# Patient Record
Sex: Female | Born: 1990 | Race: Black or African American | Hispanic: No | Marital: Single | Smoking: Former smoker
Health system: Southern US, Community
[De-identification: ages and names within clinical notes are randomized; demographics above are authoritative.]

## PROBLEM LIST (undated history)

## (undated) ENCOUNTER — Inpatient Hospital Stay (HOSPITAL_COMMUNITY): Payer: Self-pay

## (undated) DIAGNOSIS — A749 Chlamydial infection, unspecified: Secondary | ICD-10-CM

## (undated) HISTORY — PX: NO PAST SURGERIES: SHX2092

---

## 2012-05-03 ENCOUNTER — Emergency Department (INDEPENDENT_AMBULATORY_CARE_PROVIDER_SITE_OTHER)
Admission: EM | Admit: 2012-05-03 | Discharge: 2012-05-03 | Disposition: A | Discharge status: Home / Self Care | Payer: Medicaid Other | Source: Home / Self Care | Attending: Family Medicine | Admitting: Family Medicine

## 2012-05-03 ENCOUNTER — Emergency Department (INDEPENDENT_AMBULATORY_CARE_PROVIDER_SITE_OTHER): Payer: Medicaid Other

## 2012-05-03 ENCOUNTER — Encounter (HOSPITAL_COMMUNITY): Payer: Self-pay

## 2012-05-03 DIAGNOSIS — S93409A Sprain of unspecified ligament of unspecified ankle, initial encounter: Secondary | ICD-10-CM

## 2012-05-03 DIAGNOSIS — S93401A Sprain of unspecified ligament of right ankle, initial encounter: Secondary | ICD-10-CM

## 2012-05-03 MED ORDER — TRAMADOL HCL 50 MG PO TABS: 50.0000 mg | ORAL_TABLET | Freq: Four times a day (QID) | ORAL | Status: DC | PRN

## 2012-05-03 MED ORDER — IBUPROFEN 600 MG PO TABS: 600.0000 mg | ORAL_TABLET | Freq: Three times a day (TID) | ORAL | Status: DC | PRN

## 2012-05-03 NOTE — ED Notes (Signed)
C/o right ankle pain, injured last night going down stairs

## 2012-05-03 NOTE — ED Provider Notes (Signed)
History     CSN: [id number]  Arrival date & time 05/03/12  4098   First MD Initiated Contact with Patient 05/03/12 [phone]      Chief Complaint  Patient presents with  . Ankle Pain    (Consider location/radiation/quality/duration/timing/severity/associated sxs/prior treatment) HPI Comments: 21 year old female otherwise healthy. Here complaining of right ankle pain and swelling after she injured while going down stairs and tripping bending her right food in inverted position. Has been able to put weight on it but is tender when walking also increased pain with foot flexion and extension. Has not taken any pain medications and has not used ice or any other triple therapies. Denies dizziness, palpitations or syncope associated with her fall. Denies injury to any other body areas.    History reviewed. No pertinent past medical history.  History reviewed. No pertinent past surgical history.  No family history on file.  History  Substance Use Topics  . Smoking status: Not on file  . Smokeless tobacco: Not on file  . Alcohol Use: Not on file    OB History    Grav Para Term Preterm Abortions TAB SAB Ect Mult Living                  Review of Systems  Constitutional:       10 systems reviewed and  pertinent negative and positive symptoms are as per HPI.     HENT: Negative for neck pain.   Musculoskeletal:       Right ankle pain as per history of present illness  Neurological: Negative for dizziness, syncope and weakness.    Allergies  Review of patient's allergies indicates no known allergies.  Home Medications   Current Outpatient Rx  Name Route Sig Dispense Refill  . IBUPROFEN 600 MG PO TABS Oral Take 1 tablet (600 mg total) by mouth every 8 (eight) hours as needed for pain. 20 tablet 0  . TRAMADOL HCL 50 MG PO TABS Oral Take 1 tablet (50 mg total) by mouth every 6 (six) hours as needed for pain. 15 tablet 0    BP 122/73  Pulse 80  Temp 98.4 F (36.9 C) (Oral)   Resp 16  SpO2 99%  LMP 04/13/2012  Physical Exam  Nursing note and vitals reviewed. Constitutional: She is oriented to person, place, and time. She appears well-developed and well-nourished.  HENT:  Head: Normocephalic and atraumatic.  Eyes: Conjunctivae normal are normal.  Neck: Normal range of motion. Neck supple.  Cardiovascular: Normal heart sounds.   Pulmonary/Chest: Breath sounds normal.  Musculoskeletal:       Right foot: No obvious deformity. Weight bearing with reported discomfort with walking. tenderness to palpation and mild ankle swelling around lateral malleoli. Fair range of motion but with reported pain with flexion and extension. Impress no laxity on tilt test. No bruising, echymosis or hematomas. No tenderness or bruising over tibial bone.  Right foot appears neurovascularly intact.   Neurological: She is alert and oriented to person, place, and time.  Skin: No rash noted.    ED Course  Procedures (including critical care time)  Labs Reviewed - No data to display    1. Right ankle sprain       MDM  No fractures or acute findings on x-rays. Treated with ASO brace. Ibuprofen, tramadol and rehabilitation exercises. Asked to return if persistent symptoms after 1 or 2 weeks.        Sharin Grave, MD 05/05/12 [phone]

## 2012-05-03 NOTE — ED Notes (Signed)
Patient states she did not get written rx on d/c ; replacement rx called in to [facility] of [address] [place]  For same

## 2012-06-06 ENCOUNTER — Encounter (HOSPITAL_COMMUNITY): Payer: Self-pay

## 2012-06-06 ENCOUNTER — Emergency Department (HOSPITAL_COMMUNITY)
Admission: EM | Admit: 2012-06-06 | Discharge: 2012-06-06 | Disposition: A | Discharge status: Home / Self Care | Payer: PRIVATE HEALTH INSURANCE | Attending: Emergency Medicine | Admitting: Emergency Medicine

## 2012-06-06 DIAGNOSIS — N751 Abscess of Bartholin's gland: Secondary | ICD-10-CM | POA: Insufficient documentation

## 2012-06-06 DIAGNOSIS — Z79899 Other long term (current) drug therapy: Secondary | ICD-10-CM | POA: Insufficient documentation

## 2012-06-06 MED ORDER — LIDOCAINE HCL 1 % IJ SOLN
INTRAMUSCULAR | Status: AC
  Filled 2012-06-06: qty 20

## 2012-06-06 MED ORDER — ONDANSETRON 4 MG PO TBDP
4.0000 mg | ORAL_TABLET | Freq: Once | ORAL | Status: AC
  Administered 2012-06-06: 4 mg via ORAL
  Filled 2012-06-06: qty 1

## 2012-06-06 MED ORDER — SULFAMETHOXAZOLE-TRIMETHOPRIM 800-160 MG PO TABS: 1.0000 | ORAL_TABLET | Freq: Two times a day (BID) | ORAL | Status: DC

## 2012-06-06 MED ORDER — FLUCONAZOLE 200 MG PO TABS: 200.0000 mg | ORAL_TABLET | Freq: Every day | ORAL | Status: DC

## 2012-06-06 MED ORDER — OXYCODONE-ACETAMINOPHEN 5-325 MG PO TABS: 2.0000 | ORAL_TABLET | ORAL | Status: DC | PRN

## 2012-06-06 MED ORDER — LIDOCAINE HCL (PF) 1 % IJ SOLN
5.0000 mL | Freq: Once | INTRAMUSCULAR | Status: DC
  Filled 2012-06-06: qty 5

## 2012-06-06 MED ORDER — HYDROMORPHONE HCL PF 1 MG/ML IJ SOLN
1.0000 mg | Freq: Once | INTRAMUSCULAR | Status: AC
  Administered 2012-06-06: 1 mg via INTRAMUSCULAR
  Filled 2012-06-06: qty 1

## 2012-06-06 NOTE — ED Notes (Signed)
Patient states she has an abscess on her right labia that has been getting worse x 1 week. Patient denies any drainage at this time.

## 2012-06-06 NOTE — ED Provider Notes (Signed)
History     CSN: [id number]  Arrival date & time 06/06/12  0930   First MD Initiated Contact with Patient 06/06/12 1012      Chief Complaint  Patient presents with  . Abscess    (Consider location/radiation/quality/duration/timing/severity/associated sxs/prior treatment) HPI Comments: PT presents with one week hx of abscess to vagina.  It has been increasingly painful.  No drainage.  Taking OTC meds without relief.  No fevers.  No vomiting.  No hx of similar symptoms.  Has had constant throbbing pain to vagina.   History reviewed. No pertinent past medical history.  History reviewed. No pertinent past surgical history.  History reviewed. No pertinent family history.  History  Substance Use Topics  . Smoking status: Never Smoker   . Smokeless tobacco: Never Used  . Alcohol Use: Yes     weekend only    OB History    Grav Para Term Preterm Abortions TAB SAB Ect Mult Living                  Review of Systems  Constitutional: Negative for fever, chills, diaphoresis and fatigue.  HENT: Negative for congestion, rhinorrhea and sneezing.   Eyes: Negative.   Respiratory: Negative for cough, chest tightness and shortness of breath.   Cardiovascular: Negative for chest pain and leg swelling.  Gastrointestinal: Negative for nausea, vomiting, abdominal pain, diarrhea and blood in stool.  Genitourinary: Positive for genital sores and vaginal pain. Negative for frequency, hematuria, flank pain and difficulty urinating.  Musculoskeletal: Negative for back pain and arthralgias.  Skin: Negative for rash.  Neurological: Negative for dizziness, speech difficulty, weakness, numbness and headaches.    Allergies  Review of patient's allergies indicates no known allergies.  Home Medications   Current Outpatient Rx  Name Route Sig Dispense Refill  . IBUPROFEN 600 MG PO TABS Oral Take 1 tablet (600 mg total) by mouth every 8 (eight) hours as needed for pain. 20 tablet 0  . TRAMADOL  HCL 50 MG PO TABS Oral Take 1 tablet (50 mg total) by mouth every 6 (six) hours as needed for pain. 15 tablet 0  . FLUCONAZOLE 200 MG PO TABS Oral Take 1 tablet (200 mg total) by mouth daily. 1 tablet 0  . OXYCODONE-ACETAMINOPHEN 5-325 MG PO TABS Oral Take 2 tablets by mouth every 4 (four) hours as needed for pain. 15 tablet 0  . SULFAMETHOXAZOLE-TRIMETHOPRIM 800-160 MG PO TABS Oral Take 1 tablet by mouth 2 (two) times daily. 20 tablet 0    BP 132/77  Pulse 101  Temp 98.3 F (36.8 C) (Oral)  Resp 24  Ht 5\' 3"  (1.6 m)  Wt 170 lb (77.111 kg)  BMI 30.11 kg/m2  SpO2 100%  LMP 05/10/2012  Physical Exam  Constitutional: She is oriented to person, place, and time. She appears well-developed and well-nourished.  HENT:  Head: Normocephalic and atraumatic.  Eyes: Pupils are equal, round, and reactive to light.  Neck: Normal range of motion. Neck supple.  Cardiovascular: Normal rate, regular rhythm and normal heart sounds.   Pulmonary/Chest: Effort normal and breath sounds normal. No respiratory distress. She has no wheezes. She has no rales. She exhibits no tenderness.  Abdominal: Soft. Bowel sounds are normal. There is no tenderness. There is no rebound and no guarding.  Genitourinary:       2cm fluctuant abscess to medial vulva on right side.  Mild surrounding erythema  Musculoskeletal: Normal range of motion. She exhibits no edema.  Lymphadenopathy:  She has no cervical adenopathy.  Neurological: She is alert and oriented to person, place, and time.  Skin: Skin is warm and dry. No rash noted.  Psychiatric: She has a normal mood and affect.    ED Course  INCISION AND DRAINAGE Date/Time: 06/06/2012 11:54 AM Performed by: Mirriam Vadala Authorized by: Rolan Bucco Consent: Verbal consent obtained. Risks and benefits: risks, benefits and alternatives were discussed Consent given by: patient Patient identity confirmed: verbally with patient Type: abscess Body area:  anogenital Location details: Bartholin's gland Anesthesia: local infiltration Local anesthetic: lidocaine 1% without epinephrine Anesthetic total: 2 ml Patient sedated: no Scalpel size: 11 Incision type: single straight Complexity: simple Drainage: purulent Drainage amount: moderate Wound treatment: drain placed Patient tolerance: Patient tolerated the procedure well with no immediate complications. Comments: Word catheter placed   (including critical care time)  Labs Reviewed - No data to display No results found.   1. Bartholin's gland abscess       MDM  Pt started on bactrim, percocet.  Advised pt that she needs to f/u with ob/gyn for further management.  Advised if she cannot get in to see gyn, or if she has worsening symptoms, will need to return to ED        Rolan Bucco, MD 06/06/12 1159

## 2012-08-24 ENCOUNTER — Emergency Department (HOSPITAL_COMMUNITY)
Admission: EM | Admit: 2012-08-24 | Discharge: 2012-08-24 | Disposition: A | Discharge status: Home / Self Care | Payer: Self-pay

## 2012-08-24 NOTE — ED Notes (Signed)
Pt did not answer when called for registration or when called to be triage.

## 2013-01-02 ENCOUNTER — Inpatient Hospital Stay (HOSPITAL_COMMUNITY): Payer: PRIVATE HEALTH INSURANCE

## 2013-01-02 ENCOUNTER — Inpatient Hospital Stay (HOSPITAL_COMMUNITY)
Admission: AD | Admit: 2013-01-02 | Discharge: 2013-01-02 | Disposition: A | Discharge status: Home / Self Care | Payer: PRIVATE HEALTH INSURANCE | Source: Ambulatory Visit | Attending: Obstetrics & Gynecology | Admitting: Obstetrics & Gynecology

## 2013-01-02 ENCOUNTER — Emergency Department (HOSPITAL_COMMUNITY)
Admission: EM | Admit: 2013-01-02 | Discharge: 2013-01-02 | Disposition: A | Discharge status: Other Acute Inpatient Hospital | Payer: PRIVATE HEALTH INSURANCE | Attending: Emergency Medicine | Admitting: Emergency Medicine

## 2013-01-02 ENCOUNTER — Encounter (HOSPITAL_COMMUNITY): Payer: Self-pay | Admitting: *Deleted

## 2013-01-02 ENCOUNTER — Encounter (HOSPITAL_COMMUNITY): Payer: Self-pay

## 2013-01-02 DIAGNOSIS — O200 Threatened abortion: Secondary | ICD-10-CM

## 2013-01-02 DIAGNOSIS — O209 Hemorrhage in early pregnancy, unspecified: Secondary | ICD-10-CM | POA: Insufficient documentation

## 2013-01-02 DIAGNOSIS — B9689 Other specified bacterial agents as the cause of diseases classified elsewhere: Secondary | ICD-10-CM | POA: Insufficient documentation

## 2013-01-02 DIAGNOSIS — N949 Unspecified condition associated with female genital organs and menstrual cycle: Secondary | ICD-10-CM | POA: Insufficient documentation

## 2013-01-02 DIAGNOSIS — A499 Bacterial infection, unspecified: Secondary | ICD-10-CM | POA: Insufficient documentation

## 2013-01-02 DIAGNOSIS — N760 Acute vaginitis: Secondary | ICD-10-CM | POA: Insufficient documentation

## 2013-01-02 DIAGNOSIS — N939 Abnormal uterine and vaginal bleeding, unspecified: Secondary | ICD-10-CM

## 2013-01-02 DIAGNOSIS — O2390 Unspecified genitourinary tract infection in pregnancy, unspecified trimester: Secondary | ICD-10-CM | POA: Insufficient documentation

## 2013-01-02 DIAGNOSIS — N938 Other specified abnormal uterine and vaginal bleeding: Secondary | ICD-10-CM | POA: Insufficient documentation

## 2013-01-02 LAB — CBC
Hemoglobin: 12.1 g/dL (ref 12.0–15.0)
RBC: 3.85 MIL/uL — ABNORMAL LOW (ref 3.87–5.11)
WBC: 10.9 10*3/uL — ABNORMAL HIGH (ref 4.0–10.5)

## 2013-01-02 LAB — URINALYSIS, ROUTINE W REFLEX MICROSCOPIC
Bilirubin Urine: NEGATIVE
Glucose, UA: NEGATIVE mg/dL
Specific Gravity, Urine: <1.005 — ABNORMAL LOW (ref 1.005–1.030)
Urobilinogen, UA: 0.2 mg/dL (ref 0.0–1.0)
pH: 6 (ref 5.0–8.0)

## 2013-01-02 LAB — WET PREP, GENITAL
Trich, Wet Prep: NONE SEEN
Yeast Wet Prep HPF POC: NONE SEEN

## 2013-01-02 LAB — HCG, QUANTITATIVE, PREGNANCY: hCG, Beta Chain, Quant, S: 32968 m[IU]/mL — ABNORMAL HIGH (ref <5)

## 2013-01-02 MED ORDER — METRONIDAZOLE 500 MG PO TABS: 500.0000 mg | ORAL_TABLET | Freq: Two times a day (BID) | ORAL | Status: DC

## 2013-01-02 NOTE — MAU Note (Signed)
Vaginal bleeding since Friday am. Started out as spotting but alittle more now. No pain. I see Dr Jerry Caras.

## 2013-01-02 NOTE — ED Notes (Signed)
Pt presents w/ c/o vaginal bleeding x 3 days, light spotting. Pt denies n/v/d. Pt G2P0.

## 2013-01-02 NOTE — ED Notes (Signed)
Pt screened by Ebbie Ridge, PA-C and sent to [facility] for evaluation. Discharged, in no acute distress, ambulatory, stable.

## 2013-01-02 NOTE — MAU Provider Note (Signed)
History     CSN: [id number]  Arrival date and time: 01/02/13 0050   First Provider Initiated Contact with Patient 01/02/13 0122      Chief Complaint  Patient presents with  . Vaginal Bleeding   HPI  Pt is a G2P0010 at 6.5 wks IUP here with report of vaginal bleeding since Friday am. Bleeding started out as spotting but is increasing in amount.  Denies pelvic pain.  +appt with  Dr Claiborne Billings June 2.     History reviewed. No pertinent past medical history.  History reviewed. No pertinent past surgical history.  No family history on file.  History  Substance Use Topics  . Smoking status: Current Every Day Smoker  . Smokeless tobacco: Never Used  . Alcohol Use: Yes     Comment: quit weeks ago    Allergies: No Known Allergies  Prescriptions prior to admission  Medication Sig Dispense Refill  . fluconazole (DIFLUCAN) 200 MG tablet Take 1 tablet (200 mg total) by mouth daily.  1 tablet  0  . ibuprofen (ADVIL,MOTRIN) 600 MG tablet Take 1 tablet (600 mg total) by mouth every 8 (eight) hours as needed for pain.  20 tablet  0  . oxyCODONE-acetaminophen (PERCOCET/ROXICET) 5-325 MG per tablet Take 2 tablets by mouth every 4 (four) hours as needed for pain.  15 tablet  0  . sulfamethoxazole-trimethoprim (SEPTRA DS) 800-160 MG per tablet Take 1 tablet by mouth 2 (two) times daily.  20 tablet  0  . traMADol (ULTRAM) 50 MG tablet Take 1 tablet (50 mg total) by mouth every 6 (six) hours as needed for pain.  15 tablet  0    Review of Systems  Genitourinary:       Vaginal bleeding  All other systems reviewed and are negative.   Physical Exam   Blood pressure 122/60, pulse 89, temperature 98.7 F (37.1 C), temperature source Oral, resp. rate 18, height 5' 3.5" (1.613 m), weight 70.217 kg (154 lb 12.8 oz), last menstrual period 11/16/2012, SpO2 100.00%.  Physical Exam  Constitutional: She is oriented to person, place, and time. She appears well-developed and well-nourished. No  distress.  HENT:  Head: Normocephalic.  Neck: Normal range of motion. Neck supple.  Cardiovascular: Normal rate, regular rhythm and normal heart sounds.   Respiratory: Effort normal and breath sounds normal. No respiratory distress.  GI: Soft. There is no tenderness.  Genitourinary: There is bleeding (scant) around the vagina.  Musculoskeletal: Normal range of motion.  Neurological: She is alert and oriented to person, place, and time.  Skin: Skin is warm and dry.    MAU Course  Procedures  Results for orders placed during the hospital encounter of 01/02/13 (from the past 24 hour(s))  URINALYSIS, ROUTINE W REFLEX MICROSCOPIC     Status: Abnormal   Collection Time    01/02/13  1:10 AM      Result Value Range   Color, Urine YELLOW  YELLOW   APPearance CLEAR  CLEAR   Specific Gravity, Urine <1.005 (*) 1.005 - 1.030   pH 6.0  5.0 - 8.0   Glucose, UA NEGATIVE  NEGATIVE mg/dL   Hgb urine dipstick MODERATE (*) NEGATIVE   Bilirubin Urine NEGATIVE  NEGATIVE   Ketones, ur NEGATIVE  NEGATIVE mg/dL   Protein, ur NEGATIVE  NEGATIVE mg/dL   Urobilinogen, UA 0.2  0.0 - 1.0 mg/dL   Nitrite NEGATIVE  NEGATIVE   Leukocytes, UA NEGATIVE  NEGATIVE  URINE MICROSCOPIC-ADD ON     Status:  None   Collection Time    01/02/13  1:10 AM      Result Value Range   Squamous Epithelial / LPF RARE  RARE   WBC, UA 0-2  <3 WBC/hpf   RBC / HPF 0-2  <3 RBC/hpf   Bacteria, UA RARE  RARE  POCT PREGNANCY, URINE     Status: Abnormal   Collection Time    01/02/13  1:12 AM      Result Value Range   Preg Test, Ur POSITIVE (*) NEGATIVE  CBC     Status: Abnormal   Collection Time    01/02/13  1:21 AM      Result Value Range   WBC 10.9 (*) 4.0 - 10.5 K/uL   RBC 3.85 (*) 3.87 - 5.11 MIL/uL   Hemoglobin 12.1  12.0 - 15.0 g/dL   HCT 53.6 (*) 64.4 - 03.4 %   MCV 88.8  78.0 - 100.0 fL   MCH 31.4  26.0 - 34.0 pg   MCHC 35.4  30.0 - 36.0 g/dL   RDW 74.2  59.5 - 63.8 %   Platelets 240  150 - 400 K/uL  HCG,  QUANTITATIVE, PREGNANCY     Status: Abnormal   Collection Time    01/02/13  1:21 AM      Result Value Range   hCG, Beta Chain, Quant, S 75643 (*) <5 mIU/mL  WET PREP, GENITAL     Status: Abnormal   Collection Time    01/02/13  1:21 AM      Result Value Range   Yeast Wet Prep HPF POC NONE SEEN  NONE SEEN   Trich, Wet Prep NONE SEEN  NONE SEEN   Clue Cells Wet Prep HPF POC FEW (*) NONE SEEN   WBC, Wet Prep HPF POC FEW (*) NONE SEEN  ABO/RH     Status: None   Collection Time    01/02/13  1:21 AM      Result Value Range   ABO/RH(D) A POS     Assessment and Plan  Bacterial Vaginosis Anembryonic Pregnancy  Plan: Pt desires to wait instead of doing cytotec; will keep appt scheduled for June 2nd. Due to cystic lesion in right adnexa pt will return in 48 hrs for BHCG. Bleeding and ectopic precautions given. Consulted with Dr. Debroah Loop who agrees with plan of care.  RX Flagyl   [facility] 01/02/2013, 1:27 AM

## 2013-01-04 LAB — GC/CHLAMYDIA PROBE AMP
CT Probe RNA: NEGATIVE
GC Probe RNA: NEGATIVE

## 2013-01-10 NOTE — ED Provider Notes (Signed)
Medical screening examination/treatment/procedure(s) were performed by non-physician practitioner and as supervising physician I was immediately available for consultation/collaboration.MSE performed by [facility]  Sunnie Nielsen, MD 01/10/13 2329

## 2013-01-10 NOTE — ED Provider Notes (Signed)
Patient presented to the ER with vaginal bleeding, and was currently about 8 weeks.  Patient was stable with her vital signs the patient was explained that we do not have capabilities of running.  Lab tests, x-rays and a urine due to the fact that her systems were down.  I spoke with [facility] nurse practitioner who agreed to accept the patient in transfer.  Patient, again was reassessed and her vitals were stable and she agreed to this plan for transfer to a facility that can handle her needs.  Patient's heart and lungs were normal and she had no abdominal pain on exam  Carlyle Dolly, PA-C 01/10/13 1604

## 2013-09-20 LAB — OB RESULTS CONSOLE GC/CHLAMYDIA
CHLAMYDIA, DNA PROBE: NEGATIVE
GC PROBE AMP, GENITAL: NEGATIVE

## 2013-09-20 LAB — OB RESULTS CONSOLE ABO/RH: RH TYPE: POSITIVE

## 2013-09-20 LAB — OB RESULTS CONSOLE ANTIBODY SCREEN: Antibody Screen: NEGATIVE

## 2013-09-20 LAB — OB RESULTS CONSOLE RUBELLA ANTIBODY, IGM: Rubella: UNDETERMINED

## 2013-09-20 LAB — OB RESULTS CONSOLE RPR: RPR: NONREACTIVE

## 2013-09-20 LAB — OB RESULTS CONSOLE HIV ANTIBODY (ROUTINE TESTING): HIV: NONREACTIVE

## 2013-09-20 LAB — OB RESULTS CONSOLE HEPATITIS B SURFACE ANTIGEN: Hepatitis B Surface Ag: NEGATIVE

## 2013-11-07 ENCOUNTER — Encounter (HOSPITAL_COMMUNITY): Payer: Self-pay | Admitting: *Deleted

## 2014-03-23 LAB — OB RESULTS CONSOLE GBS: STREP GROUP B AG: NEGATIVE

## 2014-03-28 ENCOUNTER — Encounter (HOSPITAL_COMMUNITY): Payer: Self-pay | Admitting: *Deleted

## 2014-03-28 ENCOUNTER — Inpatient Hospital Stay (HOSPITAL_COMMUNITY)
Admission: AD | Admit: 2014-03-28 | Discharge: 2014-03-28 | Disposition: A | Discharge status: Home / Self Care | Payer: 59 | Source: Ambulatory Visit | Attending: Obstetrics and Gynecology | Admitting: Obstetrics and Gynecology

## 2014-03-28 DIAGNOSIS — O4700 False labor before 37 completed weeks of gestation, unspecified trimester: Secondary | ICD-10-CM | POA: Diagnosis not present

## 2014-03-28 DIAGNOSIS — O99891 Other specified diseases and conditions complicating pregnancy: Secondary | ICD-10-CM | POA: Insufficient documentation

## 2014-03-28 DIAGNOSIS — O9989 Other specified diseases and conditions complicating pregnancy, childbirth and the puerperium: Secondary | ICD-10-CM

## 2014-03-28 HISTORY — DX: Chlamydial infection, unspecified: A74.9

## 2014-03-28 LAB — WET PREP, GENITAL
Clue Cells Wet Prep HPF POC: NONE SEEN
Trich, Wet Prep: NONE SEEN

## 2014-03-28 NOTE — MAU Provider Note (Signed)
Linda Koch is a 23 y.o. G4P0020 at 5253w1d here reporting leaking fluid. RN requests speculum exam for rule out SROM only.   O: Spec: negative for pool, although copius, thin sl malodorous white discharge Fern: negative, TNTC WBC, ? Trich seen  FHR: 140 bpm, variability: moderate,  accelerations:  Present,  decelerations:  Absent and  TOCO irreg/irritability  A/P: intact membranes Wet prep sent to lab RN to report to MD for plan of care

## 2014-03-28 NOTE — MAU Provider Note (Signed)
I was not informed about the question of trichomoniasis seen on wet prep. Pt is currently receiving Flagyl, on day 3 for treatment of BV.  I called and spoke with Georges MouseNatalie Frazier and she stated she did not see anything moving on the slide that was collected, but sent a wet prep to be sure and that came back as negative for trich.

## 2014-03-28 NOTE — Discharge Instructions (Signed)
Third Trimester of Pregnancy °The third trimester is from week 29 through week 42, months 7 through 9. This trimester is when your unborn baby (fetus) is growing very fast. At the end of the ninth month, the unborn baby is about 20 inches in length. It weighs about 6-10 pounds.  °HOME CARE  °· Avoid all smoking, herbs, and alcohol. Avoid drugs not approved by your doctor. °· Only take medicine as told by your doctor. Some medicines are safe and some are not during pregnancy. °· Exercise only as told by your doctor. Stop exercising if you start having cramps. °· Eat regular, healthy meals. °· Wear a good support bra if your breasts are tender. °· Do not use hot tubs, steam rooms, or saunas. °· Wear your seat belt when driving. °· Avoid raw meat, uncooked cheese, and liter boxes and soil used by cats. °· Take your prenatal vitamins. °· Try taking medicine that helps you poop (stool softener) as needed, and if your doctor approves. Eat more fiber by eating fresh fruit, vegetables, and whole grains. Drink enough fluids to keep your pee (urine) clear or pale yellow. °· Take warm water baths (sitz baths) to soothe pain or discomfort caused by hemorrhoids. Use hemorrhoid cream if your doctor approves. °· If you have puffy, bulging veins (varicose veins), wear support hose. Raise (elevate) your feet for 15 minutes, 3-4 times a day. Limit salt in your diet. °· Avoid heavy lifting, wear low heels, and sit up straight. °· Rest with your legs raised if you have leg cramps or low back pain. °· Visit your dentist if you have not gone during your pregnancy. Use a soft toothbrush to brush your teeth. Be gentle when you floss. °· You can have sex (intercourse) unless your doctor tells you not to. °· Do not travel far distances unless you must. Only do so with your doctor's approval. °· Take prenatal classes. °· Practice driving to the hospital. °· Pack your hospital bag. °· Prepare the baby's room. °· Go to your doctor visits. °GET  HELP IF: °· You are not sure if you are in labor or if your water has broken. °· You are dizzy. °· You have mild cramps or pressure in your lower belly (abdominal). °· You have a nagging pain in your belly area. °· You continue to feel sick to your stomach (nauseous), throw up (vomit), or have watery poop (diarrhea). °· You have bad smelling fluid coming from your vagina. °· You have pain with peeing (urination). °GET HELP RIGHT AWAY IF:  °· You have a fever. °· You are leaking fluid from your vagina. °· You are spotting or bleeding from your vagina. °· You have severe belly cramping or pain. °· You lose or gain weight rapidly. °· You have trouble catching your breath and have chest pain. °· You notice sudden or extreme puffiness (swelling) of your face, hands, ankles, feet, or legs. °· You have not felt the baby move in over an hour. °· You have severe headaches that do not go away with medicine. °· You have vision changes. °Document Released: 10/22/2009 Document Revised: 11/22/2012 Document Reviewed: 09/28/2012 °[employer]® Patient Information ©2015 [employer]. This information is not intended to replace advice given to you by your health care provider. Make sure you discuss any questions you have with your health care provider. °Fetal Movement Counts °Patient Name: __________________________________________________ Patient Due Date: ____________________ °Performing a fetal movement count is highly recommended in high-risk pregnancies, but it is good   for every pregnant woman to do. Your health care provider may ask you to start counting fetal movements at 28 weeks of the pregnancy. Fetal movements often increase: °· After eating a full meal. °· After physical activity. °· After eating or drinking something sweet or cold. °· At rest. °Pay attention to when you feel the baby is most active. This will help you notice a pattern of your baby's sleep and wake cycles and what factors contribute to an increase in fetal  movement. It is important to perform a fetal movement count at the same time each day when your baby is normally most active.  °HOW TO COUNT FETAL MOVEMENTS °1. Find a quiet and comfortable area to sit or lie down on your left side. Lying on your left side provides the best blood and oxygen circulation to your baby. °2. Write down the day and time on a sheet of paper or in a journal. °3. Start counting kicks, flutters, swishes, rolls, or jabs in a 2-hour period. You should feel at least 10 movements within 2 hours. °4. If you do not feel 10 movements in 2 hours, wait 2-3 hours and count again. Look for a change in the pattern or not enough counts in 2 hours. °SEEK MEDICAL CARE IF: °· You feel less than 10 counts in 2 hours, tried twice. °· There is no movement in over an hour. °· The pattern is changing or taking longer each day to reach 10 counts in 2 hours. °· You feel the baby is not moving as he or she usually does. °Date: ____________ Movements: ____________ Start time: ____________ Finish time: ____________  °Date: ____________ Movements: ____________ Start time: ____________ Finish time: ____________ °Date: ____________ Movements: ____________ Start time: ____________ Finish time: ____________ °Date: ____________ Movements: ____________ Start time: ____________ Finish time: ____________ °Date: ____________ Movements: ____________ Start time: ____________ Finish time: ____________ °Date: ____________ Movements: ____________ Start time: ____________ Finish time: ____________ °Date: ____________ Movements: ____________ Start time: ____________ Finish time: ____________ °Date: ____________ Movements: ____________ Start time: ____________ Finish time: ____________  °Date: ____________ Movements: ____________ Start time: ____________ Finish time: ____________ °Date: ____________ Movements: ____________ Start time: ____________ Finish time: ____________ °Date: ____________ Movements: ____________ Start time:  ____________ Finish time: ____________ °Date: ____________ Movements: ____________ Start time: ____________ Finish time: ____________ °Date: ____________ Movements: ____________ Start time: ____________ Finish time: ____________ °Date: ____________ Movements: ____________ Start time: ____________ Finish time: ____________ °Date: ____________ Movements: ____________ Start time: ____________ Finish time: ____________  °Date: ____________ Movements: ____________ Start time: ____________ Finish time: ____________ °Date: ____________ Movements: ____________ Start time: ____________ Finish time: ____________ °Date: ____________ Movements: ____________ Start time: ____________ Finish time: ____________ °Date: ____________ Movements: ____________ Start time: ____________ Finish time: ____________ °Date: ____________ Movements: ____________ Start time: ____________ Finish time: ____________ °Date: ____________ Movements: ____________ Start time: ____________ Finish time: ____________ °Date: ____________ Movements: ____________ Start time: ____________ Finish time: ____________  °Date: ____________ Movements: ____________ Start time: ____________ Finish time: ____________ °Date: ____________ Movements: ____________ Start time: ____________ Finish time: ____________ °Date: ____________ Movements: ____________ Start time: ____________ Finish time: ____________ °Date: ____________ Movements: ____________ Start time: ____________ Finish time: ____________ °Date: ____________ Movements: ____________ Start time: ____________ Finish time: ____________ °Date: ____________ Movements: ____________ Start time: ____________ Finish time: ____________ °Date: ____________ Movements: ____________ Start time: ____________ Finish time: ____________  °Date: ____________ Movements: ____________ Start time: ____________ Finish time: ____________ °Date: ____________ Movements: ____________ Start time: ____________ Finish time: ____________ °Date:  ____________ Movements: ____________ Start time: ____________ Finish time: ____________ °Date: ____________   Movements: ____________ Start time: ____________ Finish time: ____________ °Date: ____________ Movements: ____________ Start time: ____________ Finish time: ____________ °Date: ____________ Movements: ____________ Start time: ____________ Finish time: ____________ °Date: ____________ Movements: ____________ Start time: ____________ Finish time: ____________  °Date: ____________ Movements: ____________ Start time: ____________ Finish time: ____________ °Date: ____________ Movements: ____________ Start time: ____________ Finish time: ____________ °Date: ____________ Movements: ____________ Start time: ____________ Finish time: ____________ °Date: ____________ Movements: ____________ Start time: ____________ Finish time: ____________ °Date: ____________ Movements: ____________ Start time: ____________ Finish time: ____________ °Date: ____________ Movements: ____________ Start time: ____________ Finish time: ____________ °Date: ____________ Movements: ____________ Start time: ____________ Finish time: ____________  °Date: ____________ Movements: ____________ Start time: ____________ Finish time: ____________ °Date: ____________ Movements: ____________ Start time: ____________ Finish time: ____________ °Date: ____________ Movements: ____________ Start time: ____________ Finish time: ____________ °Date: ____________ Movements: ____________ Start time: ____________ Finish time: ____________ °Date: ____________ Movements: ____________ Start time: ____________ Finish time: ____________ °Date: ____________ Movements: ____________ Start time: ____________ Finish time: ____________ °Date: ____________ Movements: ____________ Start time: ____________ Finish time: ____________  °Date: ____________ Movements: ____________ Start time: ____________ Finish time: ____________ °Date: ____________ Movements: ____________ Start  time: ____________ Finish time: ____________ °Date: ____________ Movements: ____________ Start time: ____________ Finish time: ____________ °Date: ____________ Movements: ____________ Start time: ____________ Finish time: ____________ °Date: ____________ Movements: ____________ Start time: ____________ Finish time: ____________ °Date: ____________ Movements: ____________ Start time: ____________ Finish time: ____________ °Document Released: 08/27/2006 Document Revised: 12/12/2013 Document Reviewed: 05/24/2012 °[employer]® Patient Information ©2015 [employer]. This information is not intended to replace advice given to you by your health care provider. Make sure you discuss any questions you have with your health care provider. ° °

## 2014-03-28 NOTE — MAU Note (Signed)
Pt reports contractions and pressure. ? Leaking fluid since 1630

## 2014-03-29 ENCOUNTER — Inpatient Hospital Stay (HOSPITAL_COMMUNITY): Payer: 59 | Admitting: Anesthesiology

## 2014-03-29 ENCOUNTER — Inpatient Hospital Stay (HOSPITAL_COMMUNITY)
Admission: AD | Admit: 2014-03-29 | Discharge: 2014-04-01 | DRG: 765 | Disposition: A | Discharge status: Home / Self Care | Payer: 59 | Source: Ambulatory Visit | Attending: Obstetrics and Gynecology | Admitting: Obstetrics and Gynecology

## 2014-03-29 ENCOUNTER — Encounter (HOSPITAL_COMMUNITY)
Admission: AD | Disposition: A | Discharge status: Home / Self Care | Payer: Self-pay | Source: Ambulatory Visit | Attending: Obstetrics and Gynecology

## 2014-03-29 ENCOUNTER — Encounter (HOSPITAL_COMMUNITY): Payer: Self-pay

## 2014-03-29 ENCOUNTER — Encounter (HOSPITAL_COMMUNITY): Payer: 59 | Admitting: Anesthesiology

## 2014-03-29 DIAGNOSIS — D649 Anemia, unspecified: Secondary | ICD-10-CM | POA: Diagnosis present

## 2014-03-29 DIAGNOSIS — O4700 False labor before 37 completed weeks of gestation, unspecified trimester: Secondary | ICD-10-CM | POA: Diagnosis present

## 2014-03-29 DIAGNOSIS — O99344 Other mental disorders complicating childbirth: Secondary | ICD-10-CM | POA: Diagnosis present

## 2014-03-29 DIAGNOSIS — O99334 Smoking (tobacco) complicating childbirth: Secondary | ICD-10-CM | POA: Diagnosis present

## 2014-03-29 DIAGNOSIS — F1210 Cannabis abuse, uncomplicated: Secondary | ICD-10-CM | POA: Diagnosis present

## 2014-03-29 DIAGNOSIS — Z9889 Other specified postprocedural states: Secondary | ICD-10-CM

## 2014-03-29 DIAGNOSIS — O9902 Anemia complicating childbirth: Secondary | ICD-10-CM | POA: Diagnosis present

## 2014-03-29 LAB — CBC
HEMATOCRIT: 28.6 % — AB (ref 36.0–46.0)
HEMOGLOBIN: 9.8 g/dL — AB (ref 12.0–15.0)
MCH: 30.8 pg (ref 26.0–34.0)
MCHC: 34.3 g/dL (ref 30.0–36.0)
MCV: 89.9 fL (ref 78.0–100.0)
Platelets: 208 10*3/uL (ref 150–400)
RBC: 3.18 MIL/uL — AB (ref 3.87–5.11)
RDW: 12.9 % (ref 11.5–15.5)
WBC: 18.8 10*3/uL — AB (ref 4.0–10.5)

## 2014-03-29 LAB — RPR

## 2014-03-29 SURGERY — Surgical Case
Anesthesia: Epidural | Site: Abdomen

## 2014-03-29 MED ORDER — PENICILLIN G POTASSIUM 5000000 UNITS IJ SOLR
2.5000 10*6.[IU] | INTRAVENOUS | Status: DC
  Filled 2014-03-29 (×4): qty 2.5

## 2014-03-29 MED ORDER — OXYTOCIN 10 UNIT/ML IJ SOLN
INTRAMUSCULAR | Status: AC
  Filled 2014-03-29: qty 4

## 2014-03-29 MED ORDER — PROMETHAZINE HCL 25 MG/ML IJ SOLN: 6.2500 mg | INTRAMUSCULAR | Status: DC | PRN

## 2014-03-29 MED ORDER — KETOROLAC TROMETHAMINE 30 MG/ML IJ SOLN
INTRAMUSCULAR | Status: AC
  Filled 2014-03-29: qty 1

## 2014-03-29 MED ORDER — DIPHENHYDRAMINE HCL 50 MG/ML IJ SOLN: 12.5000 mg | INTRAMUSCULAR | Status: DC | PRN

## 2014-03-29 MED ORDER — FENTANYL 2.5 MCG/ML BUPIVACAINE 1/10 % EPIDURAL INFUSION (WH - ANES)
INTRAMUSCULAR | Status: DC | PRN
  Administered 2014-03-29: 14 mL/h via EPIDURAL

## 2014-03-29 MED ORDER — MORPHINE SULFATE (PF) 0.5 MG/ML IJ SOLN
INTRAMUSCULAR | Status: DC | PRN
  Administered 2014-03-29: 1 mg via INTRAVENOUS

## 2014-03-29 MED ORDER — MEPERIDINE HCL 25 MG/ML IJ SOLN
6.2500 mg | INTRAMUSCULAR | Status: AC | PRN
  Administered 2014-03-29 (×2): 6.25 mg via INTRAVENOUS

## 2014-03-29 MED ORDER — ONDANSETRON HCL 4 MG/2ML IJ SOLN
INTRAMUSCULAR | Status: DC | PRN
  Administered 2014-03-29: 4 mg via INTRAVENOUS

## 2014-03-29 MED ORDER — LIDOCAINE HCL (PF) 1 % IJ SOLN
INTRAMUSCULAR | Status: DC | PRN
  Administered 2014-03-29 (×2): 4 mL

## 2014-03-29 MED ORDER — SODIUM BICARBONATE 8.4 % IV SOLN
INTRAVENOUS | Status: DC | PRN
  Administered 2014-03-29 (×3): 5 mL via EPIDURAL

## 2014-03-29 MED ORDER — KETOROLAC TROMETHAMINE 30 MG/ML IJ SOLN: 30.0000 mg | Freq: Four times a day (QID) | INTRAMUSCULAR | Status: AC | PRN

## 2014-03-29 MED ORDER — OXYCODONE-ACETAMINOPHEN 5-325 MG PO TABS: 1.0000 | ORAL_TABLET | ORAL | Status: DC | PRN

## 2014-03-29 MED ORDER — ONDANSETRON HCL 4 MG/2ML IJ SOLN
4.0000 mg | Freq: Four times a day (QID) | INTRAMUSCULAR | Status: DC | PRN
  Administered 2014-03-29: 4 mg via INTRAVENOUS
  Filled 2014-03-29: qty 2

## 2014-03-29 MED ORDER — BUTORPHANOL TARTRATE 1 MG/ML IJ SOLN
1.0000 mg | INTRAMUSCULAR | Status: DC | PRN
  Administered 2014-03-29 (×2): 1 mg via INTRAVENOUS
  Filled 2014-03-29 (×2): qty 1

## 2014-03-29 MED ORDER — MEPERIDINE HCL 25 MG/ML IJ SOLN: 6.2500 mg | INTRAMUSCULAR | Status: DC | PRN

## 2014-03-29 MED ORDER — LACTATED RINGERS IV SOLN
INTRAVENOUS | Status: DC | PRN
  Administered 2014-03-29 (×2): via INTRAVENOUS

## 2014-03-29 MED ORDER — ONDANSETRON HCL 4 MG/2ML IJ SOLN
INTRAMUSCULAR | Status: AC
  Filled 2014-03-29: qty 2

## 2014-03-29 MED ORDER — PHENYLEPHRINE 40 MCG/ML (10ML) SYRINGE FOR IV PUSH (FOR BLOOD PRESSURE SUPPORT)
PREFILLED_SYRINGE | INTRAVENOUS | Status: DC
Start: 2014-03-29 — End: 2014-03-29
  Filled 2014-03-29: qty 10

## 2014-03-29 MED ORDER — LIDOCAINE HCL (PF) 1 % IJ SOLN
30.0000 mL | INTRAMUSCULAR | Status: DC | PRN
  Filled 2014-03-29: qty 30

## 2014-03-29 MED ORDER — CEFAZOLIN SODIUM-DEXTROSE 2-3 GM-% IV SOLR
INTRAVENOUS | Status: DC | PRN
  Administered 2014-03-29: 2 g via INTRAVENOUS

## 2014-03-29 MED ORDER — MEPERIDINE HCL 25 MG/ML IJ SOLN
INTRAMUSCULAR | Status: DC | PRN
  Administered 2014-03-29: 25 mg via INTRAVENOUS

## 2014-03-29 MED ORDER — LACTATED RINGERS IV SOLN
40.0000 [IU] | INTRAVENOUS | Status: DC | PRN
  Administered 2014-03-29: 40 [IU] via INTRAVENOUS

## 2014-03-29 MED ORDER — LACTATED RINGERS IV SOLN
500.0000 mL | INTRAVENOUS | Status: DC | PRN
  Administered 2014-03-29: 1000 mL via INTRAVENOUS

## 2014-03-29 MED ORDER — MEPERIDINE HCL 25 MG/ML IJ SOLN
INTRAMUSCULAR | Status: AC
  Filled 2014-03-29: qty 1

## 2014-03-29 MED ORDER — ACETAMINOPHEN 325 MG PO TABS: 650.0000 mg | ORAL_TABLET | ORAL | Status: DC | PRN

## 2014-03-29 MED ORDER — IBUPROFEN 600 MG PO TABS: 600.0000 mg | ORAL_TABLET | Freq: Four times a day (QID) | ORAL | Status: DC | PRN

## 2014-03-29 MED ORDER — OXYTOCIN 40 UNITS IN LACTATED RINGERS INFUSION - SIMPLE MED
62.5000 mL/h | INTRAVENOUS | Status: DC
  Filled 2014-03-29: qty 1000

## 2014-03-29 MED ORDER — 0.9 % SODIUM CHLORIDE (POUR BTL) OPTIME
TOPICAL | Status: DC | PRN
  Administered 2014-03-29: 1000 mL

## 2014-03-29 MED ORDER — PHENYLEPHRINE 40 MCG/ML (10ML) SYRINGE FOR IV PUSH (FOR BLOOD PRESSURE SUPPORT): 80.0000 ug | PREFILLED_SYRINGE | INTRAVENOUS | Status: DC | PRN

## 2014-03-29 MED ORDER — LACTATED RINGERS IV SOLN
INTRAVENOUS | Status: DC
  Administered 2014-03-29: 1000 mL via INTRAVENOUS
  Administered 2014-03-29: 11:00:00 via INTRAVENOUS

## 2014-03-29 MED ORDER — SCOPOLAMINE 1 MG/3DAYS TD PT72
1.0000 | MEDICATED_PATCH | Freq: Once | TRANSDERMAL | Status: AC
  Administered 2014-03-29: 1.5 mg via TRANSDERMAL

## 2014-03-29 MED ORDER — PENICILLIN G POTASSIUM 5000000 UNITS IJ SOLR
5.0000 10*6.[IU] | Freq: Once | INTRAMUSCULAR | Status: AC
  Administered 2014-03-29: 5 10*6.[IU] via INTRAVENOUS
  Filled 2014-03-29: qty 5

## 2014-03-29 MED ORDER — PHENYLEPHRINE HCL 10 MG/ML IJ SOLN
INTRAMUSCULAR | Status: DC | PRN
  Administered 2014-03-29 (×2): 80 ug via INTRAVENOUS
  Administered 2014-03-29 (×2): 40 ug via INTRAVENOUS
  Administered 2014-03-29 (×2): 80 ug via INTRAVENOUS

## 2014-03-29 MED ORDER — MORPHINE SULFATE (PF) 0.5 MG/ML IJ SOLN
INTRAMUSCULAR | Status: DC | PRN
  Administered 2014-03-29: 4 mg via EPIDURAL

## 2014-03-29 MED ORDER — CITRIC ACID-SODIUM CITRATE 334-500 MG/5ML PO SOLN
30.0000 mL | ORAL | Status: DC | PRN
  Administered 2014-03-29 (×2): 30 mL via ORAL
  Filled 2014-03-29 (×2): qty 15

## 2014-03-29 MED ORDER — MIDAZOLAM HCL 2 MG/2ML IJ SOLN: 0.5000 mg | Freq: Once | INTRAMUSCULAR | Status: AC | PRN

## 2014-03-29 MED ORDER — FLEET ENEMA 7-19 GM/118ML RE ENEM: 1.0000 | ENEMA | RECTAL | Status: DC | PRN

## 2014-03-29 MED ORDER — KETOROLAC TROMETHAMINE 30 MG/ML IJ SOLN
30.0000 mg | Freq: Four times a day (QID) | INTRAMUSCULAR | Status: AC | PRN
  Administered 2014-03-29: 30 mg via INTRAVENOUS

## 2014-03-29 MED ORDER — LACTATED RINGERS IV SOLN
INTRAVENOUS | Status: DC | PRN
  Administered 2014-03-29: 22:00:00 via INTRAVENOUS

## 2014-03-29 MED ORDER — OXYTOCIN BOLUS FROM INFUSION: 500.0000 mL | INTRAVENOUS | Status: DC

## 2014-03-29 MED ORDER — EPHEDRINE 5 MG/ML INJ: 10.0000 mg | INTRAVENOUS | Status: DC | PRN

## 2014-03-29 MED ORDER — MORPHINE SULFATE 0.5 MG/ML IJ SOLN
INTRAMUSCULAR | Status: AC
  Filled 2014-03-29: qty 10

## 2014-03-29 MED ORDER — FENTANYL 2.5 MCG/ML BUPIVACAINE 1/10 % EPIDURAL INFUSION (WH - ANES)
INTRAMUSCULAR | Status: AC
  Filled 2014-03-29: qty 125

## 2014-03-29 MED ORDER — FENTANYL CITRATE 0.05 MG/ML IJ SOLN: 25.0000 ug | INTRAMUSCULAR | Status: DC | PRN

## 2014-03-29 MED ORDER — FENTANYL 2.5 MCG/ML BUPIVACAINE 1/10 % EPIDURAL INFUSION (WH - ANES)
14.0000 mL/h | INTRAMUSCULAR | Status: DC | PRN
  Administered 2014-03-29: 14 mL/h via EPIDURAL

## 2014-03-29 MED ORDER — PHENYLEPHRINE 40 MCG/ML (10ML) SYRINGE FOR IV PUSH (FOR BLOOD PRESSURE SUPPORT)
PREFILLED_SYRINGE | INTRAVENOUS | Status: AC
  Filled 2014-03-29: qty 10

## 2014-03-29 MED ORDER — SCOPOLAMINE 1 MG/3DAYS TD PT72
MEDICATED_PATCH | TRANSDERMAL | Status: AC
  Filled 2014-03-29: qty 1

## 2014-03-29 MED ORDER — LACTATED RINGERS IV SOLN: 500.0000 mL | Freq: Once | INTRAVENOUS | Status: DC

## 2014-03-29 SURGICAL SUPPLY — 34 items
BENZOIN TINCTURE PRP APPL 2/3 (GAUZE/BANDAGES/DRESSINGS) ×3 IMPLANT
BLADE SURG 10 STRL SS (BLADE) ×6 IMPLANT
CLAMP CORD UMBIL (MISCELLANEOUS) IMPLANT
CLOSURE WOUND 1/2 X4 (GAUZE/BANDAGES/DRESSINGS) ×1
CLOTH BEACON ORANGE TIMEOUT ST (SAFETY) ×3 IMPLANT
DRAPE LG THREE QUARTER DISP (DRAPES) IMPLANT
DRSG OPSITE POSTOP 4X10 (GAUZE/BANDAGES/DRESSINGS) ×3 IMPLANT
DURAPREP 26ML APPLICATOR (WOUND CARE) ×3 IMPLANT
ELECT REM PT RETURN 9FT ADLT (ELECTROSURGICAL) ×3
ELECTRODE REM PT RTRN 9FT ADLT (ELECTROSURGICAL) ×1 IMPLANT
EXTRACTOR VACUUM BELL STYLE (SUCTIONS) ×3 IMPLANT
GLOVE BIO SURGEON STRL SZ7 (GLOVE) ×3 IMPLANT
GOWN STRL REUS W/TWL LRG LVL3 (GOWN DISPOSABLE) ×6 IMPLANT
KIT ABG SYR 3ML LUER SLIP (SYRINGE) ×3 IMPLANT
NEEDLE HYPO 25X5/8 SAFETYGLIDE (NEEDLE) IMPLANT
NS IRRIG 1000ML POUR BTL (IV SOLUTION) ×3 IMPLANT
PACK C SECTION WH (CUSTOM PROCEDURE TRAY) ×3 IMPLANT
PAD OB MATERNITY 4.3X12.25 (PERSONAL CARE ITEMS) ×3 IMPLANT
RETRACTOR WND ALEXIS 25 LRG (MISCELLANEOUS) ×1 IMPLANT
RTRCTR WOUND ALEXIS 25CM LRG (MISCELLANEOUS) ×3
STAPLER VISISTAT 35W (STAPLE) IMPLANT
STRIP CLOSURE SKIN 1/2X4 (GAUZE/BANDAGES/DRESSINGS) ×2 IMPLANT
SUT MNCRL 0 VIOLET CTX 36 (SUTURE) ×2 IMPLANT
SUT MONOCRYL 0 CTX 36 (SUTURE) ×4
SUT PDS AB 0 CTX 60 (SUTURE) ×3 IMPLANT
SUT PLAIN 2 0 XLH (SUTURE) ×3 IMPLANT
SUT VIC AB 0 CT1 27 (SUTURE) ×4
SUT VIC AB 0 CT1 27XBRD ANBCTR (SUTURE) ×2 IMPLANT
SUT VIC AB 2-0 CT1 27 (SUTURE) ×2
SUT VIC AB 2-0 CT1 TAPERPNT 27 (SUTURE) ×1 IMPLANT
SUT VIC AB 4-0 KS 27 (SUTURE) ×3 IMPLANT
TOWEL OR 17X24 6PK STRL BLUE (TOWEL DISPOSABLE) ×3 IMPLANT
TRAY FOLEY CATH 14FR (SET/KITS/TRAYS/PACK) ×3 IMPLANT
WATER STERILE IRR 1000ML POUR (IV SOLUTION) ×3 IMPLANT

## 2014-03-29 NOTE — Progress Notes (Signed)
FHTS 140s gSTV, NST R; mild to moderate variables have been present but not persitently moderate.  Scalp stim present.    Toco q 4-5 SVE 9 to AL/80/-2  IUPC placed for amnio infusion.  Will monitoro strip.

## 2014-03-29 NOTE — MAU Note (Signed)
Report given to birthing suites charge RN. Will go to 170

## 2014-03-29 NOTE — Brief Op Note (Signed)
03/29/2014  10:19 PM  PATIENT:  Linda Koch  23 y.o. female  PRE-OPERATIVE DIAGNOSIS: severe variables with tachycardia remote from vaginal delivery.  POST-OPERATIVE DIAGNOSIS: same  PROCEDURE:  Procedure(s): CESAREAN SECTION (N/Koch)  SURGEON:  Surgeon(s) and Role:    * Loney LaurenceMichelle Koch Avamae Dehaan, MD - Primary  ANESTHESIA:   epidural  EBL:  Total I/O In: 1500 [I.V.:1500] Out: 900 [Urine:300; Blood:600]  SPECIMEN:  Source of Specimen:  placenta  DISPOSITION OF SPECIMEN:  PATHOLOGY  COUNTS:  YES  TOURNIQUET:  * No tourniquets in log *  DICTATION: .Note written in EPIC  PLAN OF CARE: Admit to inpatient   PATIENT DISPOSITION:  PACU - hemodynamically stable.   Delay start of Pharmacological VTE agent (>24hrs) due to surgical blood loss or risk of bleeding: not applicable  Complications:  none Medications:  Ancef, Pitocin Findings:  Baby female, Apgars 7,9, weight P.   Normal tubes, ovaries and uterus seen.  Baby was skin to skin with mother after birth in the OR.  Reason for operation: Shortly after my last note and after the amnioinfusion was running in, [facility] ([facility]60[facility] variable decels with an increase in baseline to tachycardia.  These persisted and did not stop.  Pt was informed and consented for C/S for non reassuring FHTs; although she was upset at having to have Koch C/S she verbalized understanding and agreed to proceed.   Technique:  After adequate epidural anesthesia was achieved, the patient was prepped and draped in usual sterile fashion.  Koch foley catheter was used to drain the bladder.  Koch pfannanstiel incision was made with the scalpel and carried down to the fascia with the bovie cautery. The fascia was incised in the midline with the scalpel and carried in Koch transverse curvilinear manner bilaterally.  The fascia was reflected superiorly and inferiorly off the rectus muscles and the muscles split in the midline.  Koch bowel free portion of the peritoneum  was entered bluntly and then extended in Koch superior and inferior manner with good visualization of the bowel and bladder.  The Alexis instrument was then placed and the vesico-uterine fascia tented up and incised in Koch transverse curvilinear manner.  Koch 2 cm transverse incision was made in the upper portion of the lower uterine segment until the amnion was exposed.   The incision was extended transversely in Koch blunt manner.  Clear fluid was noted and the baby was direct OP with Koch nuchal cord.  The baby's face presented through the incision and it was necessary to lift the upper edge of the uterus up, flex the head and return the face intrauterine and deliver the vertex.  The baby was otherwise delivered in the vertex presentation without complication.  The baby was bulb suctioned and the cord was clamped and cut.  The baby was then handed to awaiting Neonatology.  The placenta was then delivered manually and the uterus cleared of all debris.  The uterine incision was then closed with Koch running lock stitch of 0 monocryl.  An imbricating layer of 0 monocryl was closed as well. Excellent hemostasis of the uterine incision was achieved and the abdomen was cleared with irrigation.  The peritoneum was closed with Koch running stitch of 2-0 vicryl.  This incorporated the rectus muscles as Koch separate layer.  The fascia was then closed with Koch running stitch of 0 vicryl.  The subcutaneous layer was closed with interrupted  stitches of 2-0 plain gut.  The skin was closed with  4-0 vicryl on Koch Keith needle and steri-strips.  The patient tolerated the procedure well and was returned to the recovery room in stable condition.  All counts were correct times three.  Linda Koch

## 2014-03-29 NOTE — H&P (Signed)
23 y.o. [id number]  [id number] comes in c/o labor.  [facility]  Continued with ctxes this am and presented at office.  Dr. Tenny Crawoss checked pt and found to have changed to 4 cm.  Otherwise has good fetal movement and no bleeding.  Past Medical History  Diagnosis Date  . Chlamydia     Past Surgical History  Procedure Laterality Date  . No past surgeries      OB History  Gravida Para Term Preterm AB SAB TAB Ectopic Multiple Living  4 0   2 1 1    0    # Outcome Date GA Lbr Len/2nd Weight Sex Delivery Anes PTL Lv  4 CUR           3 GRA              Comments: System Generated. Please review and update pregnancy details.  2 TAB           1 SAB               History   Social History  . Marital Status: Single    Spouse Name: N/A    Number of Children: N/A  . Years of Education: N/A   Occupational History  . Not on file.   Social History Main Topics  . Smoking status: Current Every Day Smoker  . Smokeless tobacco: Never Used  . Alcohol Use: Yes     Comment: quit weeks ago  . Drug Use: Yes    Special: Marijuana     Comment: last time Wednesday  . Sexual Activity: Yes    Birth Control/ Protection: Condom, None   Other Topics Concern  . Not on file   Social History Narrative  . No narrative on file   Review of patient's allergies indicates no known allergies.    Prenatal Transfer Tool  Maternal Diabetes: No Genetic Screening: Declined Maternal Ultrasounds/Referrals: Normal Fetal Ultrasounds or other Referrals:  None Maternal Substance Abuse:  No Significant Maternal Medications:  None Significant Maternal Lab Results: None  Other PNC: uncomplicated.    Filed Vitals:   03/29/14 0907  BP: 110/98  Pulse: 118  Temp: 97.6 F (36.4 C)  Resp: 22     Lungs/Cor:  NAD Abdomen:  soft, gravid Ex:  no cords, erythema SVE:  4/90/-2 per Dr. Tenny Crawoss. FHTs:  120, good STV, NST R Toco:  q3-5   A/P   Term labor.   GBS  neg.  Morrissa Shein A

## 2014-03-29 NOTE — Anesthesia Procedure Notes (Signed)
Epidural Patient location during procedure: OB Start time: 03/29/2014 5:51 PM  Staffing Anesthesiologist: Abrar Bilton A. Performed by: anesthesiologist   Preanesthetic Checklist Completed: patient identified, site marked, surgical consent, pre-op evaluation, timeout performed, IV checked, risks and benefits discussed and monitors and equipment checked  Epidural Patient position: sitting Prep: site prepped and draped and DuraPrep Patient monitoring: continuous pulse ox and blood pressure Approach: midline Location: L3-L4 Injection technique: LOR air  Needle:  Needle type: Tuohy  Needle gauge: 17 G Needle length: 9 cm and 9 Needle insertion depth: 5 cm cm Catheter type: closed end flexible Catheter size: 19 Gauge Catheter at skin depth: 10 cm Test dose: negative and Other  Assessment Events: blood not aspirated, injection not painful, no injection resistance, negative IV test and no paresthesia  Additional Notes Patient identified. Risks and benefits discussed including failed block, incomplete  Pain control, post dural puncture headache, nerve damage, paralysis, blood pressure Changes, nausea, vomiting, reactions to medications-both toxic and allergic and post Partum back pain. All questions were answered. Patient expressed understanding and wished to proceed. Sterile technique was used throughout procedure. Epidural site was Dressed with sterile barrier dressing. No paresthesias, signs of intravascular injection Or signs of intrathecal spread were encountered.  Patient was more comfortable after the epidural was dosed. Please see RN's note for documentation of vital signs and FHR which are stable.

## 2014-03-29 NOTE — Anesthesia Postprocedure Evaluation (Signed)
Anesthesia Post Note  Patient: Linda Koch  Procedure(s) Performed: Procedure(s) (LRB): CESAREAN SECTION (N/A)  Anesthesia type: Epidural  Patient location: PACU  Post pain: Pain level controlled  Post assessment: Post-op Vital signs reviewed  Last Vitals:  Filed Vitals:   03/29/14 2234  BP:   Pulse:   Temp: 37.6 C  Resp:     Post vital signs: Reviewed  Level of consciousness: awake  Complications: No apparent anesthesia complications

## 2014-03-29 NOTE — Op Note (Signed)
03/29/2014  10:19 PM  PATIENT:  Linda Koch  23 y.o. female  PRE-OPERATIVE DIAGNOSIS: severe variables with tachycardia remote from vaginal delivery.  POST-OPERATIVE DIAGNOSIS: same  PROCEDURE:  Procedure(s): CESAREAN SECTION (N/A)  SURGEON:  Surgeon(s) and Role:    * Loney LaurenceMichelle A Kieren Ricci, MD - Primary  ANESTHESIA:   epidural  EBL:  Total I/O In: 1500 [I.V.:1500] Out: 900 [Urine:300; Blood:600]  SPECIMEN:  Source of Specimen:  placenta  DISPOSITION OF SPECIMEN:  PATHOLOGY  COUNTS:  YES  TOURNIQUET:  * No tourniquets in log *  DICTATION: .Note written in EPIC  PLAN OF CARE: Admit to inpatient   PATIENT DISPOSITION:  PACU - hemodynamically stable.   Delay start of Pharmacological VTE agent (>24hrs) due to surgical blood loss or risk of bleeding: not applicable  Complications:  none Medications:  Ancef, Pitocin Findings:  Baby female, Apgars 7,9, weight P; cord gas 7.21.   Normal tubes, ovaries and uterus seen.  Baby was skin to skin with mother after birth in the OR.  Reason for operation: Shortly after my last note and after the amnioinfusion was running in, [facility] severe (60x60) variable decels with an increase in baseline to tachycardia.  These persisted and did not stop.  Pt was informed and consented for C/S for non reassuring FHTs; although she was upset at having to have a C/S she verbalized understanding and agreed to proceed.   Technique:  After adequate epidural anesthesia was achieved, the patient was prepped and draped in usual sterile fashion.  A foley catheter was used to drain the bladder.  A pfannanstiel incision was made with the scalpel and carried down to the fascia with the bovie cautery. The fascia was incised in the midline with the scalpel and carried in a transverse curvilinear manner bilaterally.  The fascia was reflected superiorly and inferiorly off the rectus muscles and the muscles split in the midline.  A bowel free portion of  the peritoneum was entered bluntly and then extended in a superior and inferior manner with good visualization of the bowel and bladder.  The Alexis instrument was then placed and the vesico-uterine fascia tented up and incised in a transverse curvilinear manner.  A 2 cm transverse incision was made in the upper portion of the lower uterine segment until the amnion was exposed.   The incision was extended transversely in a blunt manner.  Clear fluid was noted and the baby was direct OP with a nuchal cord.  The baby's face presented through the incision and it was necessary to lift the upper edge of the uterus up, flex the head and return the face intrauterine and deliver the vertex.  The baby was otherwise delivered in the vertex presentation without complication.  The baby was bulb suctioned and the cord was clamped and cut.  The baby was then handed to awaiting Neonatology.  The placenta was then delivered manually and the uterus cleared of all debris.  The uterine incision was then closed with a running lock stitch of 0 monocryl.  An imbricating layer of 0 monocryl was closed as well. Excellent hemostasis of the uterine incision was achieved and the abdomen was cleared with irrigation.  The peritoneum was closed with a running stitch of 2-0 vicryl.  This incorporated the rectus muscles as a separate layer.  The fascia was then closed with a running stitch of 0 vicryl.  The subcutaneous layer was closed with interrupted  stitches of 2-0 plain gut.  The skin  was closed with 4-0 vicryl on a Keith needle and steri-strips.  The patient tolerated the procedure well and was returned to the recovery room in stable condition.  All counts were correct times three.  Kendon Sedeno A

## 2014-03-29 NOTE — MAU Note (Signed)
Pt presents from the office as a direct admit. Checked at hospital and was 4cm. 90% effaced. Denies any complications in pregnancy. Denies vaginal bleeding and discharge.

## 2014-03-29 NOTE — Transfer of Care (Signed)
Immediate Anesthesia Transfer of Care Note  Patient: Linda Koch  Procedure(s) Performed: Procedure(s): CESAREAN SECTION (N/A)  Patient Location: PACU  Anesthesia Type:Epidural  Level of Consciousness: awake, alert  and oriented  Airway & Oxygen Therapy: Patient Spontanous Breathing  Post-op Assessment: Report given to PACU RN and Post -op Vital signs reviewed and stable  Post vital signs: Reviewed and stable  Complications: No apparent anesthesia complications

## 2014-03-29 NOTE — Anesthesia Preprocedure Evaluation (Addendum)
Anesthesia Evaluation  Patient identified by MRN, date of birth, ID band Patient awake    Reviewed: Allergy & Precautions, H&P , Patient's Chart, lab work & pertinent test results  Airway Mallampati: III TM Distance: >3 FB Neck ROM: full    Dental no notable dental hx. (+) Teeth Intact   Pulmonary neg pulmonary ROS, Current Smoker,  breath sounds clear to auscultation  Pulmonary exam normal       Cardiovascular negative cardio ROS  Rhythm:regular Rate:Normal     Neuro/Psych negative neurological ROS  negative psych ROS   GI/Hepatic negative GI ROS, Neg liver ROS,   Endo/Other  negative endocrine ROSObesity  Renal/GU negative Renal ROS  negative genitourinary   Musculoskeletal   Abdominal Normal abdominal exam  (+)   Peds  Hematology  (+) anemia ,   Anesthesia Other Findings   Reproductive/Obstetrics (+) Pregnancy                         Anesthesia Physical Anesthesia Plan  ASA: II  Anesthesia Plan: Epidural   Post-op Pain Management:    Induction:   Airway Management Planned:   Additional Equipment:   Intra-op Plan:   Post-operative Plan:   Informed Consent: I have reviewed the patients History and Physical, chart, labs and discussed the procedure including the risks, benefits and alternatives for the proposed anesthesia with the patient or authorized representative who has indicated his/her understanding and acceptance.     Plan Discussed with: Anesthesiologist  Anesthesia Plan Comments:         Anesthesia Quick Evaluation

## 2014-03-29 NOTE — MAU Note (Signed)
Called house coverage regarding pump modules so IV fluids and antibiotics can be administered. Will bring modules to unit

## 2014-03-30 ENCOUNTER — Encounter (HOSPITAL_COMMUNITY): Payer: Self-pay

## 2014-03-30 DIAGNOSIS — Z9889 Other specified postprocedural states: Secondary | ICD-10-CM

## 2014-03-30 LAB — CBC
HEMATOCRIT: 28.9 % — AB (ref 36.0–46.0)
Hemoglobin: 9.8 g/dL — ABNORMAL LOW (ref 12.0–15.0)
MCH: 30.6 pg (ref 26.0–34.0)
MCHC: 33.9 g/dL (ref 30.0–36.0)
MCV: 90.3 fL (ref 78.0–100.0)
Platelets: 215 10*3/uL (ref 150–400)
RBC: 3.2 MIL/uL — ABNORMAL LOW (ref 3.87–5.11)
RDW: 13 % (ref 11.5–15.5)
WBC: 24.5 10*3/uL — ABNORMAL HIGH (ref 4.0–10.5)

## 2014-03-30 MED ORDER — OXYCODONE-ACETAMINOPHEN 5-325 MG PO TABS
1.0000 | ORAL_TABLET | ORAL | Status: DC | PRN
  Administered 2014-03-31 – 2014-04-01 (×3): 1 via ORAL
  Administered 2014-04-01: 2 via ORAL
  Filled 2014-03-30: qty 1
  Filled 2014-03-30: qty 2
  Filled 2014-03-30 (×2): qty 1

## 2014-03-30 MED ORDER — SIMETHICONE 80 MG PO CHEW
80.0000 mg | CHEWABLE_TABLET | ORAL | Status: DC
  Administered 2014-03-30 – 2014-03-31 (×2): 80 mg via ORAL
  Filled 2014-03-30 (×2): qty 1

## 2014-03-30 MED ORDER — NALBUPHINE HCL 10 MG/ML IJ SOLN: 5.0000 mg | INTRAMUSCULAR | Status: DC | PRN

## 2014-03-30 MED ORDER — TETANUS-DIPHTH-ACELL PERTUSSIS 5-2.5-18.5 LF-MCG/0.5 IM SUSP
0.5000 mL | Freq: Once | INTRAMUSCULAR | Status: AC
  Administered 2014-04-01: 0.5 mL via INTRAMUSCULAR
  Filled 2014-03-30: qty 0.5

## 2014-03-30 MED ORDER — DIPHENHYDRAMINE HCL 25 MG PO CAPS: 25.0000 mg | ORAL_CAPSULE | ORAL | Status: DC | PRN

## 2014-03-30 MED ORDER — DIPHENHYDRAMINE HCL 25 MG PO CAPS
25.0000 mg | ORAL_CAPSULE | Freq: Four times a day (QID) | ORAL | Status: DC | PRN
Start: 2014-03-30 — End: 2014-04-02

## 2014-03-30 MED ORDER — WITCH HAZEL-GLYCERIN EX PADS: 1.0000 "application " | MEDICATED_PAD | CUTANEOUS | Status: DC | PRN

## 2014-03-30 MED ORDER — DIPHENHYDRAMINE HCL 50 MG/ML IJ SOLN
12.5000 mg | INTRAMUSCULAR | Status: DC | PRN
Start: 2014-03-30 — End: 2014-04-02

## 2014-03-30 MED ORDER — PRENATAL MULTIVITAMIN CH
1.0000 | ORAL_TABLET | Freq: Every day | ORAL | Status: DC
  Administered 2014-03-30 – 2014-04-01 (×3): 1 via ORAL
  Filled 2014-03-30 (×3): qty 1

## 2014-03-30 MED ORDER — DIPHENHYDRAMINE HCL 50 MG/ML IJ SOLN: 25.0000 mg | INTRAMUSCULAR | Status: DC | PRN

## 2014-03-30 MED ORDER — ZOLPIDEM TARTRATE 5 MG PO TABS: 5.0000 mg | ORAL_TABLET | Freq: Every evening | ORAL | Status: DC | PRN

## 2014-03-30 MED ORDER — OXYTOCIN 40 UNITS IN LACTATED RINGERS INFUSION - SIMPLE MED: 62.5000 mL/h | INTRAVENOUS | Status: AC

## 2014-03-30 MED ORDER — ONDANSETRON HCL 4 MG/2ML IJ SOLN: 4.0000 mg | Freq: Three times a day (TID) | INTRAMUSCULAR | Status: DC | PRN

## 2014-03-30 MED ORDER — IBUPROFEN 600 MG PO TABS: 600.0000 mg | ORAL_TABLET | Freq: Four times a day (QID) | ORAL | Status: DC | PRN

## 2014-03-30 MED ORDER — ACETAMINOPHEN 500 MG PO TABS
1000.0000 mg | ORAL_TABLET | Freq: Four times a day (QID) | ORAL | Status: AC
  Administered 2014-03-30 (×2): 1000 mg via ORAL
  Filled 2014-03-30 (×2): qty 2

## 2014-03-30 MED ORDER — METOCLOPRAMIDE HCL 5 MG/ML IJ SOLN: 10.0000 mg | Freq: Three times a day (TID) | INTRAMUSCULAR | Status: DC | PRN

## 2014-03-30 MED ORDER — ONDANSETRON HCL 4 MG/2ML IJ SOLN: 4.0000 mg | INTRAMUSCULAR | Status: DC | PRN

## 2014-03-30 MED ORDER — SIMETHICONE 80 MG PO CHEW
80.0000 mg | CHEWABLE_TABLET | Freq: Three times a day (TID) | ORAL | Status: DC
  Administered 2014-03-30 – 2014-04-01 (×8): 80 mg via ORAL
  Filled 2014-03-30 (×8): qty 1

## 2014-03-30 MED ORDER — MENTHOL 3 MG MT LOZG: 1.0000 | LOZENGE | OROMUCOSAL | Status: DC | PRN

## 2014-03-30 MED ORDER — MEASLES, MUMPS & RUBELLA VAC ~~LOC~~ INJ
0.5000 mL | INJECTION | Freq: Once | SUBCUTANEOUS | Status: AC
  Administered 2014-04-01: 0.5 mL via SUBCUTANEOUS
  Filled 2014-03-30 (×2): qty 0.5

## 2014-03-30 MED ORDER — LACTATED RINGERS IV SOLN
INTRAVENOUS | Status: DC
  Administered 2014-03-30 (×2): via INTRAVENOUS

## 2014-03-30 MED ORDER — LANOLIN HYDROUS EX OINT: 1.0000 "application " | TOPICAL_OINTMENT | CUTANEOUS | Status: DC | PRN

## 2014-03-30 MED ORDER — BISACODYL 10 MG RE SUPP: 10.0000 mg | Freq: Every day | RECTAL | Status: DC | PRN

## 2014-03-30 MED ORDER — NALOXONE HCL 1 MG/ML IJ SOLN
1.0000 ug/kg/h | INTRAVENOUS | Status: DC | PRN
  Filled 2014-03-30: qty 2

## 2014-03-30 MED ORDER — METHYLERGONOVINE MALEATE 0.2 MG/ML IJ SOLN: 0.2000 mg | INTRAMUSCULAR | Status: DC | PRN

## 2014-03-30 MED ORDER — DIBUCAINE 1 % RE OINT: 1.0000 "application " | TOPICAL_OINTMENT | RECTAL | Status: DC | PRN

## 2014-03-30 MED ORDER — FERROUS SULFATE 325 (65 FE) MG PO TABS
325.0000 mg | ORAL_TABLET | Freq: Two times a day (BID) | ORAL | Status: DC
  Administered 2014-03-30 – 2014-04-01 (×5): 325 mg via ORAL
  Filled 2014-03-30 (×5): qty 1

## 2014-03-30 MED ORDER — SODIUM CHLORIDE 0.9 % IJ SOLN: 3.0000 mL | INTRAMUSCULAR | Status: DC | PRN

## 2014-03-30 MED ORDER — NALOXONE HCL 0.4 MG/ML IJ SOLN: 0.4000 mg | INTRAMUSCULAR | Status: DC | PRN

## 2014-03-30 MED ORDER — IBUPROFEN 600 MG PO TABS
600.0000 mg | ORAL_TABLET | Freq: Four times a day (QID) | ORAL | Status: DC
  Administered 2014-03-30 – 2014-04-01 (×10): 600 mg via ORAL
  Filled 2014-03-30 (×10): qty 1

## 2014-03-30 MED ORDER — METHYLERGONOVINE MALEATE 0.2 MG PO TABS: 0.2000 mg | ORAL_TABLET | ORAL | Status: DC | PRN

## 2014-03-30 MED ORDER — ONDANSETRON HCL 4 MG PO TABS: 4.0000 mg | ORAL_TABLET | ORAL | Status: DC | PRN

## 2014-03-30 MED ORDER — SIMETHICONE 80 MG PO CHEW: 80.0000 mg | CHEWABLE_TABLET | ORAL | Status: DC | PRN

## 2014-03-30 MED ORDER — FLEET ENEMA 7-19 GM/118ML RE ENEM: 1.0000 | ENEMA | Freq: Every day | RECTAL | Status: DC | PRN

## 2014-03-30 MED ORDER — SENNOSIDES-DOCUSATE SODIUM 8.6-50 MG PO TABS
2.0000 | ORAL_TABLET | ORAL | Status: DC
  Administered 2014-03-30 – 2014-03-31 (×2): 2 via ORAL
  Filled 2014-03-30: qty 2

## 2014-03-30 NOTE — Progress Notes (Signed)
Patient is eating, ambulating, foley in place.  Pain control is good. Bleeding appropriate  Filed Vitals:   03/30/14 0405 03/30/14 0407 03/30/14 0610 03/30/14 0815  BP: 105/57 105/57 111/58 97/55  Pulse: 98 102 92 89  Temp:   97.9 F (36.6 C) 97.8 F (36.6 C)  TempSrc:   Oral Oral  Resp:   22 18  Height:      Weight:      SpO2:   97% 97%   RRR CTAB Fundus firm Bandage c/d/i Perineum without swelling.  Lab Results  Component Value Date   WBC 24.5* 03/30/2014   HGB 9.8* 03/30/2014   HCT 28.9* 03/30/2014   MCV 90.3 03/30/2014   PLT 215 03/30/2014   Rh+  A/P Post op day 1.  Routine care.  Expect d/c tomorrow.    [place], Wellstar Spalding Regional HospitalDYANNA

## 2014-03-30 NOTE — Anesthesia Postprocedure Evaluation (Signed)
  Anesthesia Post-op Note  Patient: Linda Koch  Procedure(s) Performed: Procedure(s): CESAREAN SECTION (N/A)  Patient Location: Mother/Baby  Anesthesia Type:Epidural  Level of Consciousness: awake  Airway and Oxygen Therapy: Patient Spontanous Breathing  Post-op Pain: none  Post-op Assessment: Post-op Vital signs reviewed, Patient's Cardiovascular Status Stable, Respiratory Function Stable, RESPIRATORY FUNCTION UNSTABLE, No signs of Nausea or vomiting, Adequate PO intake, Pain level controlled, No headache, No backache, No residual numbness and No residual motor weakness  Post-op Vital Signs: Reviewed and stable  Last Vitals:  Filed Vitals:   03/30/14 0610  BP: 111/58  Pulse: 92  Temp: 36.6 C  Resp: 22    Complications: No apparent anesthesia complications

## 2014-03-30 NOTE — Lactation Note (Signed)
This note was copied from the chart of Linda Koch [place]. Lactation Consultation Note: Initial visit with mom . She reports that baby has been latching well. Last feeding was about 3 1/2 hours ago. Offered assist with latch and mom agreeable. Mom easily able to hand express Colostrum. Baby latched well with lots of swallows noted. RN will set up pump to get her started pumping. Encouraged to pump after feeding to promote milk supply.No questions at present. BF brochure given with resources for support after DC. Late preterm info sheet for parents also given to mom. To call for assist prn.   Patient Name: Linda Koch Cundari ZOXWR'UToday's Date: 03/30/2014 Reason for consult: Initial assessment;Late preterm infant   Maternal Data Formula Feeding for Exclusion: No Has patient been taught Hand Expression?: Yes Does the patient have breastfeeding experience prior to this delivery?: No  Feeding Feeding Type: Breast Fed  LATCH Score/Interventions Latch: Grasps breast easily, tongue down, lips flanged, rhythmical sucking.  Audible Swallowing: Spontaneous and intermittent  Type of Nipple: Everted at rest and after stimulation  Comfort (Breast/Nipple): Soft / non-tender     Hold (Positioning): Assistance needed to correctly position infant at breast and maintain latch. Intervention(s): Breastfeeding basics reviewed;Support Pillows;Position options;Skin to skin  LATCH Score: 9  Lactation Tools Discussed/Used Initiated by:: RN Date initiated:: 03/30/14   Consult Status Consult Status: Follow-up Date: 03/31/14 Follow-up type: In-patient    Pamelia HoitWeeks, Yamari Ventola D 03/30/2014, 10:34 AM

## 2014-03-30 NOTE — Addendum Note (Signed)
Addendum created 03/30/14 0806 by Suella Groveoderick C Zayvion Stailey, CRNA   Modules edited: Notes Section   Notes Section:  File: [id number]

## 2014-03-31 NOTE — Progress Notes (Signed)
Clinical Social Work Department PSYCHOSOCIAL ASSESSMENT - MATERNAL/CHILD 03/31/2014  Patient:  Linda Koch, Linda Koch  Account Number:  [id number]  [place] Date:  03/29/2014  Ardine Eng Name:   Edsel Petrin    Clinical Social Worker:  Sindy Messing, LCSW   Date/Time:  03/31/2014 02:30 PM  Date Referred:  03/31/2014   Referral source  Physician     Referred reason  Substance Abuse   Other referral source:    I:  FAMILY / [place] legal guardian:  PARENT  Guardian - Name Guardian - Age Guardian - Address  Quinette Hentges [address] [place], [place] [address]  [place]  same as above   Other household support members/support persons Other support:   MOB reports her mother is supportive and will assist as needed.    II  PSYCHOSOCIAL DATA Information Source:  Patient Interview  Occupational hygienist Employment:   Works at a Estate agent resources:  Multimedia programmer If [place] / Grade:  Microbiologist / Child Services Coordination / Early Interventions:   MOB reports she had prenatal care. MOB attending birthing classes and breastfeeding class was scheduled for next week but baby was born early.  Cultural issues impacting care:   None reported    III  STRENGTHS Strengths  Adequate Resources  Home prepared for Child (including basic supplies)  Supportive family/friends   Strength comment:  MOB reports that FOB is very supportive and her family will assist as well. MOB reports she had not purchased a crib due to baby arriving early but will have separate sleeping arrangements for baby at DC.   IV  RISK FACTORS AND CURRENT PROBLEMS Current Problem:  None   Risk Factor & Current Problem Patient Issue Family Issue Risk Factor / Current Problem Comment   N N     V  SOCIAL WORK ASSESSMENT CSW received referral due to substance use during pregnancy. CSW reviewed chart and spoke with bedside  RN prior to meeting with MOB. CSW met with MOB at bedside while no visitors present. CSW introduced myself and explained role.    MOB reports this is her first child and things are going well. MOB reports she is a little anxious because baby arrived early and she was still planning on attending classes and getting baby a crib. MOB reports that she has taken off work and will stay home with baby until she is healed. FOB is involved and lives with MOB but was not in room because MOB reports he is working today. MOB reports that home is well prepared and that her family will assist when FOB is at work. MOB is aware of the importance of baby have separate place to sleep and reports they will either purchase a crib or use pack in play when they return home.    CSW spoke with MOB about her pregnancy and inquired about any emotional trauma or substance use. MOB reports she used to smoke [facility] recreationally but stopped when she found out she was pregnant. MOB reports she was about [redacted] weeks pregnant when she stopped using THC and denies any further THC or substance use during pregnancy. CSW informed MOB of drug screen on baby due to [facility] use during pregnancy. MOB reports understanding and feels that screen will be negative since she has not used in several months.      VI SOCIAL WORK PLAN Social Work Plan  No Further Intervention  Required / No Barriers to Discharge   Type of pt/family education:   Drug screen on baby   If child protective services report - county:   If child protective services report - date:   Information/referral to community resources comment:   MOB plans to follow up with breastfeeding support group since she missed her class   Other social work plan:   No barriers to DC. CSW will follow up on drug screen and make CPS report if any screen is positive.     Sindy Messing, LCSW (Coverage for Lucita Ferrara)

## 2014-03-31 NOTE — Progress Notes (Signed)
POD#2 Pt and mother are doing well.  VSSAF IMP/ stable Plan/Routine care.

## 2014-03-31 NOTE — Lactation Note (Signed)
This note was copied from the chart of Linda Koch. Lactation Consultation Note  Patient Name: Linda Koch ZOXWR'UToday's Date: 03/31/2014 Reason for consult: Follow-up assessment;Infant < 6lbs;Late preterm infant Mom has not been pumping or supplementing because baby has been BF frequently. Baby awake at this visit, had recently BF from left breast. Mom latched baby to right breast in cradle hold. LC noted baby did not have deep latch. Assisted Mom with latching baby in football hold and reviewed how to assess for depth with latch. 1-2 swallows noted while baby was BF. Baby fed for about 10 minutes then fell asleep. LC demonstrated and had Mom hand express, received approx 1-2 ml of EBM. LC discussed LPT behaviors with Mom, Mom had handout. Encouraged Mom to BF every feeding, but limit time at breast to 30 minutes, hand express/post pump and give baby back any amount of EBM she receives. At 41 hours of age, recommended baby have at least 10 ml of supplement with feedings. Mom reports she will start pumping and giving EBM supplements. Advised to monitor voids/stools, weight loss. Advised may need to supplement with formula depending on output/bili/weight loss in next 24 hours. Mom agreeable to plan. LC noted in Mom's history she used marijuana. Hand out given with warnings regarding BF and marijuana use. Call for questions/concerns.   Maternal Data    Feeding Feeding Type: Breast Fed Length of feed: 10 min  LATCH Score/Interventions Latch: Grasps breast easily, tongue down, lips flanged, rhythmical sucking. Intervention(s): Adjust position;Assist with latch;Breast massage;Breast compression  Audible Swallowing: A few with stimulation  Type of Nipple: Everted at rest and after stimulation  Comfort (Breast/Nipple): Soft / non-tender     Hold (Positioning): Assistance needed to correctly position infant at breast and maintain latch. Intervention(s): Breastfeeding basics  reviewed;Support Pillows;Position options;Skin to skin  LATCH Score: 8  Lactation Tools Discussed/Used Tools: Pump Breast pump type: Double-Electric Breast Pump   Consult Status Consult Status: Follow-up Date: 04/01/14 Follow-up type: In-patient    Linda LevinsGranger, Linda Koch 03/31/2014, 3:12 PM

## 2014-04-01 MED ORDER — OXYCODONE-ACETAMINOPHEN 5-325 MG PO TABS: 1.0000 | ORAL_TABLET | ORAL | Status: DC | PRN

## 2014-04-01 NOTE — Progress Notes (Signed)
POD#3 Pt doing well. Baby will likely stay for another day. VSSAF IMP/ stable Plan/ Will discharge mother

## 2014-04-01 NOTE — Discharge Summary (Signed)
Obstetric Discharge Summary Reason for Admission: onset of labor Prenatal Procedures: ultrasound Intrapartum Procedures: cesarean: low cervical, transverse Postpartum Procedures: none Complications-Operative and Postpartum: none Hemoglobin  Date Value Ref Range Status  03/30/2014 9.8* 12.0 - 15.0 g/dL Final     HCT  Date Value Ref Range Status  03/30/2014 28.9* 36.0 - 46.0 % Final    Physical Exam:  General: alert Lochia: appropriate Uterine Fundus: firm Incision: healing well DVT Evaluation: No evidence of DVT seen on physical exam.  Discharge Diagnoses: IUP at 36 wks, labor and fetal intolerence to labor  Discharge Information: Date: 04/01/2014 Activity: pelvic rest Diet: routine Medications: PNV and Percocet Condition: stable Instructions: refer to practice specific booklet Discharge to: home Follow-up Information   Follow up with HORVATH,MICHELLE A, MD. Schedule an appointment as soon as possible for a visit in 4 weeks.   Specialty:  Obstetrics and Gynecology   Contact information:   [address] [place] [id number] [phone]       Newborn Data: Live born female  Birth Weight: 6 lb 2.8 oz (2800 g) APGAR: 7, 9  Home with mother.  ANDERSON,MARK E 04/01/2014, 8:07 AM

## 2014-06-12 ENCOUNTER — Encounter (HOSPITAL_COMMUNITY): Payer: Self-pay

## 2014-10-12 ENCOUNTER — Inpatient Hospital Stay (HOSPITAL_COMMUNITY)
Admission: AD | Admit: 2014-10-12 | Discharge: 2014-10-12 | Disposition: A | Discharge status: Home / Self Care | Payer: 59 | Source: Ambulatory Visit | Attending: Obstetrics | Admitting: Obstetrics

## 2014-10-12 ENCOUNTER — Encounter (HOSPITAL_COMMUNITY): Payer: Self-pay | Admitting: *Deleted

## 2014-10-12 ENCOUNTER — Inpatient Hospital Stay (HOSPITAL_COMMUNITY): Payer: 59

## 2014-10-12 DIAGNOSIS — Z3A10 10 weeks gestation of pregnancy: Secondary | ICD-10-CM

## 2014-10-12 DIAGNOSIS — B9689 Other specified bacterial agents as the cause of diseases classified elsewhere: Secondary | ICD-10-CM | POA: Diagnosis not present

## 2014-10-12 DIAGNOSIS — O26851 Spotting complicating pregnancy, first trimester: Secondary | ICD-10-CM | POA: Diagnosis present

## 2014-10-12 DIAGNOSIS — Z87891 Personal history of nicotine dependence: Secondary | ICD-10-CM | POA: Diagnosis not present

## 2014-10-12 DIAGNOSIS — O4691 Antepartum hemorrhage, unspecified, first trimester: Secondary | ICD-10-CM

## 2014-10-12 DIAGNOSIS — O209 Hemorrhage in early pregnancy, unspecified: Secondary | ICD-10-CM | POA: Insufficient documentation

## 2014-10-12 DIAGNOSIS — O23591 Infection of other part of genital tract in pregnancy, first trimester: Secondary | ICD-10-CM | POA: Diagnosis not present

## 2014-10-12 DIAGNOSIS — Z3A12 12 weeks gestation of pregnancy: Secondary | ICD-10-CM | POA: Diagnosis present

## 2014-10-12 DIAGNOSIS — N760 Acute vaginitis: Secondary | ICD-10-CM | POA: Diagnosis not present

## 2014-10-12 DIAGNOSIS — O4690 Antepartum hemorrhage, unspecified, unspecified trimester: Secondary | ICD-10-CM | POA: Diagnosis present

## 2014-10-12 LAB — HCG, QUANTITATIVE, PREGNANCY: hCG, Beta Chain, Quant, S: 8851 m[IU]/mL — ABNORMAL HIGH (ref <5)

## 2014-10-12 LAB — URINE MICROSCOPIC-ADD ON

## 2014-10-12 LAB — CBC
HCT: 33.5 % — ABNORMAL LOW (ref 36.0–46.0)
Hemoglobin: 11.6 g/dL — ABNORMAL LOW (ref 12.0–15.0)
MCH: 32 pg (ref 26.0–34.0)
MCHC: 34.6 g/dL (ref 30.0–36.0)
MCV: 92.5 fL (ref 78.0–100.0)
Platelets: 291 10*3/uL (ref 150–400)
RBC: 3.62 MIL/uL — ABNORMAL LOW (ref 3.87–5.11)
RDW: 14.3 % (ref 11.5–15.5)
WBC: 8.7 10*3/uL (ref 4.0–10.5)

## 2014-10-12 LAB — WET PREP, GENITAL
Trich, Wet Prep: NONE SEEN
Yeast Wet Prep HPF POC: NONE SEEN

## 2014-10-12 LAB — URINALYSIS, ROUTINE W REFLEX MICROSCOPIC
BILIRUBIN URINE: NEGATIVE
Glucose, UA: NEGATIVE mg/dL
Ketones, ur: NEGATIVE mg/dL
Leukocytes, UA: NEGATIVE
Nitrite: NEGATIVE
PROTEIN: NEGATIVE mg/dL
SPECIFIC GRAVITY, URINE: 1.025 (ref 1.005–1.030)
Urobilinogen, UA: 0.2 mg/dL (ref 0.0–1.0)
pH: 6 (ref 5.0–8.0)

## 2014-10-12 LAB — POCT PREGNANCY, URINE: Preg Test, Ur: POSITIVE — AB

## 2014-10-12 MED ORDER — LACTATED RINGERS IV SOLN: INTRAVENOUS | Status: DC

## 2014-10-12 MED ORDER — METRONIDAZOLE 500 MG PO TABS: 500.0000 mg | ORAL_TABLET | Freq: Two times a day (BID) | ORAL | Status: DC

## 2014-10-12 NOTE — MAU Note (Signed)
Pt reprots she started spotting about 4 hours ago. Denies pain at this time.Pt reportts having intercourse yesterday.

## 2014-10-12 NOTE — Discharge Instructions (Signed)
Bacterial Vaginosis Bacterial vaginosis is a vaginal infection that occurs when the normal balance of bacteria in the vagina is disrupted. It results from an overgrowth of certain bacteria. This is the most common vaginal infection in women of childbearing age. Treatment is important to prevent complications, especially in pregnant women, as it can cause a premature delivery. CAUSES  Bacterial vaginosis is caused by an increase in harmful bacteria that are normally present in smaller amounts in the vagina. Several different kinds of bacteria can cause bacterial vaginosis. However, the reason that the condition develops is not fully understood. RISK FACTORS Certain activities or behaviors can put you at an increased risk of developing bacterial vaginosis, including:  Having a new sex partner or multiple sex partners.  Douching.  Using an intrauterine device (IUD) for contraception. Women do not get bacterial vaginosis from toilet seats, bedding, swimming pools, or contact with objects around them. SIGNS AND SYMPTOMS  Some women with bacterial vaginosis have no signs or symptoms. Common symptoms include:  Grey vaginal discharge.  A fishlike odor with discharge, especially after sexual intercourse.  Itching or burning of the vagina and vulva.  Burning or pain with urination. DIAGNOSIS  Your health care provider will take a medical history and examine the vagina for signs of bacterial vaginosis. A sample of vaginal fluid may be taken. Your health care provider will look at this sample under a microscope to check for bacteria and abnormal cells. A vaginal pH test may also be done.  TREATMENT  Bacterial vaginosis may be treated with antibiotic medicines. These may be given in the form of a pill or a vaginal cream. A second round of antibiotics may be prescribed if the condition comes back after treatment.  HOME CARE INSTRUCTIONS   Only take over-the-counter or prescription medicines as  directed by your health care provider.  If antibiotic medicine was prescribed, take it as directed. Make sure you finish it even if you start to feel better.  Do not have sex until treatment is completed.  Tell all sexual partners that you have a vaginal infection. They should see their health care provider and be treated if they have problems, such as a mild rash or itching.  Practice safe sex by using condoms and only having one sex partner. SEEK MEDICAL CARE IF:   Your symptoms are not improving after 3 days of treatment.  You have increased discharge or pain.  You have a fever. MAKE SURE YOU:   Understand these instructions.  Will watch your condition.  Will get help right away if you are not doing well or get worse. FOR MORE INFORMATION  Centers for Disease Control and Prevention, Division of STD Prevention: SolutionApps.co.zawww.cdc.gov/std American Sexual Health Association (ASHA): www.ashastd.org  Document Released: 07/28/2005 Document Revised: 05/18/2013 Document Reviewed: 03/09/2013 [facility] Patient Information 2015 [place] [employer] [place]. This information is not intended to replace advice given to you by your health care provider. Make sure you discuss any questions you have with your health care provider.    Threatened Miscarriage A threatened miscarriage occurs when you have vaginal bleeding during your first 20 weeks of pregnancy but the pregnancy has not ended. If you have vaginal bleeding during this time, your health care provider will do tests to make sure you are still pregnant. If the tests show you are still pregnant and the developing baby (fetus) inside your womb (uterus) is still growing, your condition is considered a threatened miscarriage. A threatened miscarriage does not mean your pregnancy will  it does increase the risk of losing your pregnancy (complete miscarriage). °CAUSES  °The cause of a threatened miscarriage is usually not known. If you go on to have a complete  miscarriage, the most common cause is an abnormal number of chromosomes in the developing baby. Chromosomes are the structures inside cells that hold all your genetic material. °Some causes of vaginal bleeding that do not result in miscarriage include: °· Having sex. °· Having an infection. °· Normal hormone changes of pregnancy. °· Bleeding that occurs when an egg implants in your uterus. °RISK FACTORS °Risk factors for bleeding in early pregnancy include: °· Obesity. °· Smoking. °· Drinking excessive amounts of alcohol or caffeine. °· Recreational drug use. °SIGNS AND SYMPTOMS °· Light vaginal bleeding. °· Mild abdominal pain or cramps. °DIAGNOSIS  °If you have bleeding with or without abdominal pain before 20 weeks of pregnancy, your health care provider will do tests to check whether you are still pregnant. One important test involves using sound waves and a computer (ultrasound) to create images of the inside of your uterus. Other tests include an internal exam of your vagina and uterus (pelvic exam) and measurement of your baby's heart rate.  °You may be diagnosed with a threatened miscarriage if: °· Ultrasound testing shows you are still pregnant. °· Your baby's heart rate is strong. °· A pelvic exam shows that the opening between your uterus and your vagina (cervix) is closed. °· Your heart rate and blood pressure are stable. °· Blood tests confirm you are still pregnant. °TREATMENT  °No treatments have been shown to prevent a threatened miscarriage from going on to a complete miscarriage. However, the right home care is important.  °HOME CARE INSTRUCTIONS  °· Make sure you keep all your appointments for prenatal care. This is very important. °· Get plenty of rest. °· Do not have sex or use tampons if you have vaginal bleeding. °· Do not douche. °· Do not smoke or use recreational drugs. °· Do not drink alcohol. °· Avoid caffeine. °SEEK MEDICAL CARE IF: °· You have light vaginal bleeding or spotting while  pregnant. °· You have abdominal pain or cramping. °· You have a fever. °SEEK IMMEDIATE MEDICAL CARE IF: °· You have heavy vaginal bleeding. °· You have blood clots coming from your vagina. °· You have severe low back pain or abdominal cramps. °· You have fever, chills, and severe abdominal pain. °MAKE SURE YOU: °· Understand these instructions. °· Will watch your condition. °· Will get help right away if you are not doing well or get worse. °Document Released: 07/28/2005 Document Revised: 08/02/2013 Document Reviewed: 05/24/2013 °ExitCare® Patient Information ©2015 ExitCare, LLC. This information is not intended to replace advice given to you by your health care provider. Make sure you discuss any questions you have with your health care provider. ° °

## 2014-10-12 NOTE — MAU Provider Note (Signed)
History     CSN: [id number]  Arrival date and time: 10/12/14 1533   None     Chief Complaint  Patient presents with  . Vaginal Bleeding   HPI  23 y.o.[id number].@[redacted]w[redacted]d  presents to the MAU with complaint of vaginal bleeding spotting since noon today. States that she had sex yesterday in the am. Denies any cramping. Pt has not been seen for prenatal care yet.   Past Medical History  Diagnosis Date  . Chlamydia     Past Surgical History  Procedure Laterality Date  . No past surgeries    . Cesarean section N/A 03/29/2014    Procedure: CESAREAN SECTION;  Surgeon: Loney Laurence, MD;  Location: [facility];  Service: Obstetrics;  Laterality: N/A;    Family History  Problem Relation Age of Onset  . Heart disease Maternal Grandfather     History  Substance Use Topics  . Smoking status: Former Games developer  . Smokeless tobacco: Never Used     Comment: quit iearly 2015  . Alcohol Use: Yes     Comment: rare, last was Christmas    Allergies: No Known Allergies  Prescriptions prior to admission  Medication Sig Dispense Refill Last Dose  . oxyCODONE-acetaminophen (PERCOCET/ROXICET) 5-325 MG per tablet Take 1-2 tablets by mouth every 4 (four) hours as needed for severe pain (moderate - severe pain). (Patient not taking: Reported on 10/12/2014) 30 tablet 0     Review of Systems  Constitutional: Negative for fever.  Eyes: Negative for blurred vision.  Respiratory: Negative for cough and shortness of breath.   Cardiovascular: Negative for chest pain.  Gastrointestinal: Negative for nausea, vomiting and abdominal pain.  Genitourinary: Negative for dysuria, urgency and frequency.       Vaginal spotting bright red  Musculoskeletal: Negative.   Neurological: Negative.  Negative for headaches.  Psychiatric/Behavioral: Negative.    Physical Exam   Last menstrual period 07/18/2014, unknown if currently breastfeeding.  Physical Exam  Nursing note and vitals reviewed. Constitutional: She  is oriented to person, place, and time. She appears well-developed and well-nourished. No distress.  HENT:  Head: Normocephalic and atraumatic.  Neck: Normal range of motion.  Cardiovascular: Normal rate.   Respiratory: Effort normal.  GI: Soft. She exhibits no distension and no mass. There is no tenderness. There is no rebound and no guarding.  Genitourinary: Rectal exam shows external hemorrhoid. No labial fusion. There is no rash, tenderness, lesion or injury on the right labia. There is no rash, tenderness, lesion or injury on the left labia. Uterus is enlarged. Cervix exhibits discharge. Cervix exhibits no motion tenderness. Right adnexum displays no mass, no tenderness and no fullness. Left adnexum displays no mass, no tenderness and no fullness. There is bleeding in the vagina. No erythema or tenderness in the vagina. No foreign body around the vagina. No signs of injury around the vagina. No vaginal discharge found.  Musculoskeletal: Normal range of motion.  Neurological: She is alert and oriented to person, place, and time.  Skin: Skin is warm and dry.  Psychiatric: She has a normal mood and affect. Her behavior is normal. Judgment and thought content normal.    MAU Course  Procedures  Wet Prep Gc/C Ultrasound CBC Results for orders placed or performed during the hospital encounter of 10/12/14 (from the past 24 hour(s))  Urinalysis, Routine w reflex microscopic     Status: Abnormal   Collection Time: 10/12/14  4:28 PM  Result Value Ref Range   Color, Urine YELLOW YELLOW  APPearance CLEAR CLEAR   Specific Gravity, Urine 1.025 1.005 - 1.030   pH 6.0 5.0 - 8.0   Glucose, UA NEGATIVE NEGATIVE mg/dL   Hgb urine dipstick TRACE (A) NEGATIVE   Bilirubin Urine NEGATIVE NEGATIVE   Ketones, ur NEGATIVE NEGATIVE mg/dL   Protein, ur NEGATIVE NEGATIVE mg/dL   Urobilinogen, UA 0.2 0.0 - 1.0 mg/dL   Nitrite NEGATIVE NEGATIVE   Leukocytes, UA NEGATIVE NEGATIVE  Urine microscopic-add on      Status: Abnormal   Collection Time: 10/12/14  4:28 PM  Result Value Ref Range   Squamous Epithelial / LPF FEW (A) RARE   WBC, UA 0-2 <3 WBC/hpf   RBC / HPF 0-2 <3 RBC/hpf   Bacteria, UA RARE RARE  Pregnancy, urine POC     Status: Abnormal   Collection Time: 10/12/14  4:32 PM  Result Value Ref Range   Preg Test, Ur POSITIVE (A) NEGATIVE  CBC     Status: Abnormal   Collection Time: 10/12/14  5:10 PM  Result Value Ref Range   WBC 8.7 4.0 - 10.5 K/uL   RBC 3.62 (L) 3.87 - 5.11 MIL/uL   Hemoglobin 11.6 (L) 12.0 - 15.0 g/dL   HCT 40.9 (L) 81.1 - 91.4 %   MCV 92.5 78.0 - 100.0 fL   MCH 32.0 26.0 - 34.0 pg   MCHC 34.6 30.0 - 36.0 g/dL   RDW 78.2 95.6 - 21.3 %   Platelets 291 150 - 400 K/uL  hCG, quantitative, pregnancy     Status: Abnormal   Collection Time: 10/12/14  5:10 PM  Result Value Ref Range   hCG, Beta Chain, Quant, S 8851 (H) <5 mIU/mL  Wet prep, genital     Status: Abnormal   Collection Time: 10/12/14  5:10 PM  Result Value Ref Range   Yeast Wet Prep HPF POC NONE SEEN NONE SEEN   Trich, Wet Prep NONE SEEN NONE SEEN   Clue Cells Wet Prep HPF POC MODERATE (A) NONE SEEN   WBC, Wet Prep HPF POC FEW (A) NONE SEEN   US Ob Comp Less 14 Wks  10/12/2014   CLINICAL DATA:  Vaginal bleeding in first trimester of pregnancy.  EXAM: OBSTETRIC <14 WK [place] AND TRANSVAGINAL OB US  TECHNIQUE: Both transabdominal and transvaginal ultrasound examinations were performed for complete evaluation of the gestation as well as the maternal uterus, adnexal regions, and pelvic cul-de-sac. Transvaginal technique was performed to assess early pregnancy.  COMPARISON:  Ultrasound of Jan 02, 2013.  FINDINGS: Intrauterine gestational sac: Irregular intrauterine fluid collection is noted with was several internal echoes.  Yolk sac:  Not visualized.  Embryo:  Not visualized.  Cardiac Activity: Not visualized.  MSD: 23.3  mm   7 w   3  d  Maternal uterus/adnexae: Ovaries appear normal. No free fluid is noted.   IMPRESSION: Findings are suspicious but not yet definitive for failed pregnancy. Recommend follow-up US in 10-14 days for definitive diagnosis. This recommendation follows SRU consensus guidelines: Diagnostic Criteria for Nonviable Pregnancy Early in the First Trimester. Malva Limes Med 2013; 086:5784-69.   Electronically Signed   By: Lupita Raider, M.D.   On: 10/12/2014 18:21   US Ob Transvaginal  10/12/2014   CLINICAL DATA:  Vaginal bleeding in first trimester of pregnancy.  EXAM: OBSTETRIC <14 WK [place] AND TRANSVAGINAL OB US  TECHNIQUE: Both transabdominal and transvaginal ultrasound examinations were performed for complete evaluation of the gestation as well as the maternal uterus, adnexal  regions, and pelvic cul-de-sac. Transvaginal technique was performed to assess early pregnancy.  COMPARISON:  Ultrasound of Jan 02, 2013.  FINDINGS: Intrauterine gestational sac: Irregular intrauterine fluid collection is noted with was several internal echoes.  Yolk sac:  Not visualized.  Embryo:  Not visualized.  Cardiac Activity: Not visualized.  MSD: 23.3  mm   7 w   3  d  Maternal uterus/adnexae: Ovaries appear normal. No free fluid is noted.  IMPRESSION: Findings are suspicious but not yet definitive for failed pregnancy. Recommend follow-up US in 10-14 days for definitive diagnosis. This recommendation follows SRU consensus guidelines: Diagnostic Criteria for Nonviable Pregnancy Early in the First Trimester. Malva Limes Engl J Med 2013; 098:1191-47; 369:1443-51.   Electronically Signed   By: Lupita RaiderJames  Green Jr, M.D.   On: 10/12/2014 18:21      Assessment and Plan   A:      Vaginal Bleeding in first trimester           Bacterial Vaginitis  P:      Repeat U/S in 10 days           Flagyl 500mg  PO BID x 7 days           Discharge to home with Miscarriage precautions   Clemmons,Lori Grissett 10/12/2014, 5:20 PM

## 2014-10-12 NOTE — MAU Note (Signed)
Urine in lab 

## 2014-10-12 NOTE — MAU Note (Signed)
Pink spotting noted earlier today, first episode of bleeding this preg, no pain.

## 2014-10-13 LAB — GC/CHLAMYDIA PROBE AMP (~~LOC~~) NOT AT ARMC
Chlamydia: NEGATIVE
Neisseria Gonorrhea: NEGATIVE

## 2014-10-13 LAB — HIV ANTIBODY (ROUTINE TESTING W REFLEX): HIV Screen 4th Generation wRfx: NONREACTIVE

## 2014-10-24 ENCOUNTER — Other Ambulatory Visit: Payer: Self-pay

## 2015-05-29 ENCOUNTER — Encounter (HOSPITAL_COMMUNITY): Payer: Self-pay | Admitting: Vascular Surgery

## 2015-05-29 ENCOUNTER — Emergency Department (HOSPITAL_COMMUNITY)
Admission: EM | Admit: 2015-05-29 | Discharge: 2015-05-30 | Disposition: A | Discharge status: Home / Self Care | Payer: 59 | Attending: Emergency Medicine | Admitting: Emergency Medicine

## 2015-05-29 DIAGNOSIS — N939 Abnormal uterine and vaginal bleeding, unspecified: Secondary | ICD-10-CM | POA: Diagnosis not present

## 2015-05-29 DIAGNOSIS — R0602 Shortness of breath: Secondary | ICD-10-CM | POA: Diagnosis not present

## 2015-05-29 DIAGNOSIS — R1013 Epigastric pain: Secondary | ICD-10-CM | POA: Insufficient documentation

## 2015-05-29 DIAGNOSIS — Z87891 Personal history of nicotine dependence: Secondary | ICD-10-CM | POA: Insufficient documentation

## 2015-05-29 DIAGNOSIS — R0789 Other chest pain: Secondary | ICD-10-CM | POA: Diagnosis not present

## 2015-05-29 DIAGNOSIS — R748 Abnormal levels of other serum enzymes: Secondary | ICD-10-CM | POA: Diagnosis not present

## 2015-05-29 DIAGNOSIS — R112 Nausea with vomiting, unspecified: Secondary | ICD-10-CM | POA: Diagnosis present

## 2015-05-29 DIAGNOSIS — Z8619 Personal history of other infectious and parasitic diseases: Secondary | ICD-10-CM | POA: Diagnosis not present

## 2015-05-29 DIAGNOSIS — R9431 Abnormal electrocardiogram [ECG] [EKG]: Secondary | ICD-10-CM

## 2015-05-29 LAB — COMPREHENSIVE METABOLIC PANEL
ALK PHOS: 45 U/L (ref 38–126)
ALT: 15 U/L (ref 14–54)
AST: 17 U/L (ref 15–41)
Albumin: 4.8 g/dL (ref 3.5–5.0)
Anion gap: 14 (ref 5–15)
BUN: 11 mg/dL (ref 6–20)
CALCIUM: 9.8 mg/dL (ref 8.9–10.3)
CO2: 29 mmol/L (ref 22–32)
CREATININE: 0.73 mg/dL (ref 0.44–1.00)
Chloride: 88 mmol/L — ABNORMAL LOW (ref 101–111)
GFR calc Af Amer: >60 mL/min (ref >60)
Glucose, Bld: 103 mg/dL — ABNORMAL HIGH (ref 65–99)
Potassium: 3.3 mmol/L — ABNORMAL LOW (ref 3.5–5.1)
Sodium: 131 mmol/L — ABNORMAL LOW (ref 135–145)
Total Bilirubin: 1.1 mg/dL (ref 0.3–1.2)
Total Protein: 8.8 g/dL — ABNORMAL HIGH (ref 6.5–8.1)

## 2015-05-29 LAB — CBC
HEMATOCRIT: 44.5 % (ref 36.0–46.0)
Hemoglobin: 15.7 g/dL — ABNORMAL HIGH (ref 12.0–15.0)
MCH: 31.4 pg (ref 26.0–34.0)
MCHC: 35.3 g/dL (ref 30.0–36.0)
MCV: 89 fL (ref 78.0–100.0)
PLATELETS: 336 10*3/uL (ref 150–400)
RBC: 5 MIL/uL (ref 3.87–5.11)
RDW: 13.4 % (ref 11.5–15.5)
WBC: 10.7 10*3/uL — AB (ref 4.0–10.5)

## 2015-05-29 LAB — I-STAT BETA HCG BLOOD, ED (MC, WL, AP ONLY)

## 2015-05-29 LAB — LIPASE, BLOOD: Lipase: 97 U/L — ABNORMAL HIGH (ref 22–51)

## 2015-05-29 LAB — I-STAT TROPONIN, ED: TROPONIN I, POC: 0 ng/mL (ref 0.00–0.08)

## 2015-05-29 MED ORDER — ONDANSETRON 4 MG PO TBDP
ORAL_TABLET | ORAL | Status: AC
  Filled 2015-05-29: qty 1

## 2015-05-29 MED ORDER — ONDANSETRON HCL 4 MG/2ML IJ SOLN
4.0000 mg | Freq: Once | INTRAMUSCULAR | Status: AC
  Administered 2015-05-30: 4 mg via INTRAVENOUS
  Filled 2015-05-29: qty 2

## 2015-05-29 MED ORDER — SODIUM CHLORIDE 0.9 % IV BOLUS (SEPSIS)
2000.0000 mL | Freq: Once | INTRAVENOUS | Status: AC
Start: 2015-05-30 — End: 2015-05-30
  Administered 2015-05-30: 2000 mL via INTRAVENOUS

## 2015-05-29 MED ORDER — PANTOPRAZOLE SODIUM 40 MG IV SOLR
40.0000 mg | Freq: Once | INTRAVENOUS | Status: AC
  Administered 2015-05-30: 40 mg via INTRAVENOUS
  Filled 2015-05-29: qty 40

## 2015-05-29 MED ORDER — ONDANSETRON 4 MG PO TBDP
4.0000 mg | ORAL_TABLET | Freq: Once | ORAL | Status: AC | PRN
  Administered 2015-05-29: 4 mg via ORAL

## 2015-05-29 MED ORDER — GI COCKTAIL ~~LOC~~
30.0000 mL | Freq: Once | ORAL | Status: AC
  Administered 2015-05-30: 30 mL via ORAL
  Filled 2015-05-29: qty 30

## 2015-05-29 NOTE — ED Notes (Signed)
Pt reports to the ED for eval of N/V, CP, fatigue, generalized weakness, and generalized body aches.  Was seen by her PCP on Friday and today and was told she may have a stomach virus. Denies any hematemesis, diarrhea, or blood in her stool. CP began on Friday. Localizes the pain to the mid sternal CP that does not radiate. Describes it as pressure. Reports associated SOB, lightheadedness, and dizziness. Pt A&Ox4, resp e/u, and skin warm, pale, and dry.

## 2015-05-29 NOTE — ED Provider Notes (Signed)
CSN: [id number]   Arrival date & time 05/29/15 2222  History  By signing my name below, I, Bethel Born, attest that this documentation has been prepared under the direction and in the presence of Loren Racer, MD. Electronically Signed: Bethel Born, ED Scribe. 05/30/2015. 2:47 AM.  Chief Complaint  Patient presents with  . Emesis    HPI The history is provided by the patient. No language interpreter was used.   Renise Gillies is a 24 y.o. female who presents to the Emergency Department complaining of nausea and vomiting with onset 6 days ago. Pt states that she has multiple episodes of non-bloody emesis per day. She is not able to tolerate her nausea medication stating that it gets stuck in her throat and Pepto bismol has provided no relief.  Associated symptoms include subjective fever, sweating, generalized weakness, 4 days of constant chest pain that she describes as pressure/heaviness, bitter taste in the mouth, and mild abdominal pain. Pt denies hematochezia, melena, dysuria, increased urinary frequency, hematuria, and LE pain or swelling. She had some diarrhea closer to the onset of symptoms but none in the last few days.  Denies frequent NSAID use. Currently menstruating. Surgical history of c-section. No history of DVT/PE or recent travel.   Past Medical History  Diagnosis Date  . Chlamydia     Past Surgical History  Procedure Laterality Date  . No past surgeries    . Cesarean section N/A 03/29/2014    Procedure: CESAREAN SECTION;  Surgeon: Loney Laurence, MD;  Location: [facility];  Service: Obstetrics;  Laterality: N/A;    Family History  Problem Relation Age of Onset  . Heart disease Maternal Grandfather     Social History  Substance Use Topics  . Smoking status: Former Games developer  . Smokeless tobacco: Never Used     Comment: quit iearly 2015  . Alcohol Use: Yes     Comment: rare, last was Christmas     Review of Systems  Constitutional: Positive for  chills. Negative for fever.  Respiratory: Positive for shortness of breath.   Cardiovascular: Positive for chest pain. Negative for palpitations and leg swelling.  Gastrointestinal: Positive for nausea, vomiting and abdominal pain. Negative for diarrhea, constipation and blood in stool.  Genitourinary: Positive for vaginal bleeding. Negative for dysuria, frequency, flank pain, vaginal discharge, difficulty urinating and pelvic pain.  Musculoskeletal: Negative for myalgias, back pain, neck pain and neck stiffness.  Skin: Negative for rash and wound.  Neurological: Positive for dizziness and light-headedness. Negative for syncope, weakness, numbness and headaches.  All other systems reviewed and are negative.  Home Medications   Prior to Admission medications   Medication Sig Start Date End Date Taking? Authorizing Provider  ondansetron (ZOFRAN ODT) 4 MG disintegrating tablet  ODT q4 hours prn nausea/vomit 05/30/15   Loren Racer, MD  pantoprazole (PROTONIX) 20 MG tablet Take 1 tablet (20 mg total) by mouth daily. 05/30/15   Loren Racer, MD    Allergies  Review of patient's allergies indicates no known allergies.  Triage Vitals: BP 110/82 mmHg  Pulse 110  Temp(Src) 98.3 F (36.8 C) (Oral)  Resp 18  SpO2 95%  LMP 05/25/2015 (Approximate)  Breastfeeding? No  Physical Exam  Constitutional: She is oriented to person, place, and time. She appears well-developed and well-nourished. No distress.  HENT:  Head: Normocephalic and atraumatic.  Mouth/Throat: Oropharynx is clear and moist. No oropharyngeal exudate.  Eyes: EOM are normal. Pupils are equal, round, and reactive to light.  Neck: Normal  range of motion. Neck supple.  Cardiovascular: Normal rate and regular rhythm.   Pulmonary/Chest: Effort normal and breath sounds normal. No respiratory distress. She has no wheezes. She has no rales. She exhibits tenderness (chest tenderness is reproduced with palpation of the parasternal  borders. There is no crepitance or deformity.).  Abdominal: Soft. Bowel sounds are normal. She exhibits no distension and no mass. There is tenderness (epigastric and left upper quadrant tenderness with palpation. No rebound or guarding.). There is no rebound and no guarding.  Musculoskeletal: Normal range of motion. She exhibits no edema or tenderness.  Neurological: She is alert and oriented to person, place, and time.  Moves all extremities without deficit. Sensation is grossly intact.  Skin: Skin is warm and dry. No rash noted. No erythema.  Psychiatric: She has a normal mood and affect. Her behavior is normal.  Nursing note and vitals reviewed.   ED Course  Procedures   DIAGNOSTIC STUDIES: Oxygen Saturation is 95% on RA, normal by my interpretation.    COORDINATION OF CARE: 11:57 PM Discussed treatment plan which includes lab work and Zofran with pt at bedside and pt agreed to plan.  2:40 AM-Consult complete with Dr. Onalee HuaAlvarez (Cardiology). Patient case explained and discussed. Call ended at 2:44 AM   Labs Reviewed  LIPASE, BLOOD - Abnormal; Notable for the following:    Lipase 97 (*)    All other components within normal limits  COMPREHENSIVE METABOLIC PANEL - Abnormal; Notable for the following:    Sodium 131 (*)    Potassium 3.3 (*)    Chloride 88 (*)    Glucose, Bld 103 (*)    Total Protein 8.8 (*)    All other components within normal limits  CBC - Abnormal; Notable for the following:    WBC 10.7 (*)    Hemoglobin 15.7 (*)    All other components within normal limits  URINALYSIS, ROUTINE W REFLEX MICROSCOPIC (NOT AT [facility])  I-STAT BETA HCG BLOOD, ED ([facility], [facility], [facility] ONLY)  I-STAT TROPOININ, ED  I-STAT TROPOININ, ED    Imaging Review [place] Abdomen Limited  05/30/2015  CLINICAL DATA:  Acute onset of upper abdominal pain, nausea and vomiting. Initial encounter. EXAM: US ABDOMEN LIMITED - RIGHT UPPER QUADRANT COMPARISON:  None. FINDINGS: Gallbladder: No gallstones or wall  thickening visualized. A gallbladder wall fold is noted. No sonographic Murphy sign noted. Common bile duct: Diameter: 0.3 cm, within normal limits in caliber. Liver: No focal lesion identified. Within normal limits in parenchymal echogenicity. IMPRESSION: Unremarkable ultrasound of the right upper quadrant. Electronically Signed   By: Roanna RaiderJeffery  Chang M.D.   On: 05/30/2015 00:44   Dg Abd Acute W/chest  05/30/2015  CLINICAL DATA:  Acute onset of epigastric abdominal pain and nausea. Initial encounter. EXAM: DG ABDOMEN ACUTE W/ 1V CHEST COMPARISON:  None. FINDINGS: The lungs are well-aerated and clear. There is no evidence of focal opacification, pleural effusion or pneumothorax. The cardiomediastinal silhouette is within normal limits. The visualized bowel gas pattern is unremarkable. Scattered stool and air are seen within the colon; there is no evidence of small bowel dilatation to suggest obstruction. No free intra-abdominal air is identified on the provided upright view. No acute osseous abnormalities are seen; the sacroiliac joints are unremarkable in appearance. IMPRESSION: 1. Unremarkable bowel gas pattern; no free intra-abdominal air seen. 2. No acute cardiopulmonary process seen. Electronically Signed   By: Roanna RaiderJeffery  Chang M.D.   On: 05/30/2015 01:03    EKG Interpretation  Date/Time:  Wednesday  May 30 2015 04:12:44 EDT Ventricular Rate:  77 PR Interval:  130 QRS Duration: 84 QT Interval:  384 QTC Calculation: 434 R Axis:   73 Text Interpretation:  Normal sinus rhythm with sinus arrhythmia Normal ECG Confirmed by Ranae Palms  MD, Granger Chui ([id number]) on 05/30/2015 4:19:05 AM  I personally reviewed and evaluated these lab results as a part of my medical decision-making.    MDM   Final diagnoses:  Epigastric pain  Non-intractable vomiting with nausea, vomiting of unspecified type  Atypical chest pain  Abnormal EKG  Elevated lipase    I, Mikela Senn, personally performed the  services described in this documentation. All medical record entries made by the scribe were at my direction and in my presence.  I have reviewed the chart and discharge instructions and agree that the record reflects my personal performance and is accurate and complete. Annalena Piatt.  05/30/2015. 6:34 AM.   Patient states both her abdominal pain and her chest pain had improved after medications. She is resting comfortably. Ultrasound and abdominal series are normal. Patient does have an abnormal EKG. We'll review with cardiology but anticipate discharge to follow-up as an outpatient.  Discussed EKG findings with Dr. Onalee Hua. He reviewed the patient's EKG. Thinks that although the patient's EKG is abnormal and is nonspecific. Given the fact the patient has chest pain for the past 4 days and a normal troponin, he believes the patient can be discharged home to follow-up with cardiology as an outpatient for repeat EKG.  Repeat troponin is normal. Patient's vital signs continued to be stable. No further vomiting in the emergency department. We'll treat for likely gastritis. Mildly elevated lipase of uncertain significance. Advised to follow-up with gastroenterology as well as cardiology. Return precautions given  Loren Racer, MD 05/30/15 [phone]

## 2015-05-30 ENCOUNTER — Emergency Department (HOSPITAL_COMMUNITY): Payer: 59

## 2015-05-30 LAB — I-STAT TROPONIN, ED: TROPONIN I, POC: 0 ng/mL (ref 0.00–0.08)

## 2015-05-30 MED ORDER — ONDANSETRON 4 MG PO TBDP: ORAL_TABLET | ORAL | Status: DC

## 2015-05-30 MED ORDER — PANTOPRAZOLE SODIUM 20 MG PO TBEC: 20.0000 mg | DELAYED_RELEASE_TABLET | Freq: Every day | ORAL | Status: DC

## 2015-05-30 NOTE — Discharge Instructions (Signed)
Gastritis, Adult Gastritis is soreness and swelling (inflammation) of the lining of the stomach. Gastritis can develop as a sudden onset (acute) or long-term (chronic) condition. If gastritis is not treated, it can lead to stomach bleeding and ulcers. CAUSES  Gastritis occurs when the stomach lining is weak or damaged. Digestive juices from the stomach then inflame the weakened stomach lining. The stomach lining may be weak or damaged due to viral or bacterial infections. One common bacterial infection is the Helicobacter pylori infection. Gastritis can also result from excessive alcohol consumption, taking certain medicines, or having too much acid in the stomach.  SYMPTOMS  In some cases, there are no symptoms. When symptoms are present, they may include:  Pain or a burning sensation in the upper abdomen.  Nausea.  Vomiting.  An uncomfortable feeling of fullness after eating. DIAGNOSIS  Your caregiver may suspect you have gastritis based on your symptoms and a physical exam. To determine the cause of your gastritis, your caregiver may perform the following:  Blood or stool tests to check for the H pylori bacterium.  Gastroscopy. A thin, flexible tube (endoscope) is passed down the esophagus and into the stomach. The endoscope has a light and camera on the end. Your caregiver uses the endoscope to view the inside of the stomach.  Taking a tissue sample (biopsy) from the stomach to examine under a microscope. TREATMENT  Depending on the cause of your gastritis, medicines may be prescribed. If you have a bacterial infection, such as an H pylori infection, antibiotics may be given. If your gastritis is caused by too much acid in the stomach, H2 blockers or antacids may be given. Your caregiver may recommend that you stop taking aspirin, ibuprofen, or other nonsteroidal anti-inflammatory drugs (NSAIDs). HOME CARE INSTRUCTIONS  Only take over-the-counter or prescription medicines as directed by  your caregiver.  If you were given antibiotic medicines, take them as directed. Finish them even if you start to feel better.  Drink enough fluids to keep your urine clear or pale yellow.  Avoid foods and drinks that make your symptoms worse, such as:  Caffeine or alcoholic drinks.  Chocolate.  Peppermint or mint flavorings.  Garlic and onions.  Spicy foods.  Citrus fruits, such as oranges, lemons, or limes.  Tomato-based foods such as sauce, chili, salsa, and pizza.  Fried and fatty foods.  Eat small, frequent meals instead of large meals. SEEK IMMEDIATE MEDICAL CARE IF:   You have black or dark red stools.  You vomit blood or material that looks like coffee grounds.  You are unable to keep fluids down.  Your abdominal pain gets worse.  You have a fever.  You do not feel better after 1 week.  You have any other questions or concerns. MAKE SURE YOU:  Understand these instructions.  Will watch your condition.  Will get help right away if you are not doing well or get worse.   This information is not intended to replace advice given to you by your health care provider. Make sure you discuss any questions you have with your health care provider.   Document Released: 07/22/2001 Document Revised: 01/27/2012 Document Reviewed: 09/10/2011 Elsevier Interactive Patient Education 2016 [employer]   Nausea and Vomiting Nausea is a sick feeling that often comes before throwing up (vomiting). Vomiting is a reflex where stomach contents come out of your mouth. Vomiting can cause severe loss of body fluids (dehydration). Children and elderly adults can become dehydrated quickly, especially if they also  have diarrhea. Nausea and vomiting are symptoms of a condition or disease. It is important to find the cause of your symptoms. CAUSES   Direct irritation of the stomach lining. This irritation can result from increased acid production (gastroesophageal reflux disease),  infection, food poisoning, taking certain medicines (such as nonsteroidal anti-inflammatory drugs), alcohol use, or tobacco use.  Signals from the brain.These signals could be caused by a headache, heat exposure, an inner ear disturbance, increased pressure in the brain from injury, infection, a tumor, or a concussion, pain, emotional stimulus, or metabolic problems.  An obstruction in the gastrointestinal tract (bowel obstruction).  Illnesses such as diabetes, hepatitis, gallbladder problems, appendicitis, kidney problems, cancer, sepsis, atypical symptoms of a heart attack, or eating disorders.  Medical treatments such as chemotherapy and radiation.  Receiving medicine that makes you sleep (general anesthetic) during surgery. DIAGNOSIS Your caregiver may ask for tests to be done if the problems do not improve after a few days. Tests may also be done if symptoms are severe or if the reason for the nausea and vomiting is not clear. Tests may include:  Urine tests.  Blood tests.  Stool tests.  Cultures (to look for evidence of infection).  X-rays or other imaging studies. Test results can help your caregiver make decisions about treatment or the need for additional tests. TREATMENT You need to stay well hydrated. Drink frequently but in small amounts.You may wish to drink water, sports drinks, clear broth, or eat frozen ice pops or gelatin dessert to help stay hydrated.When you eat, eating slowly may help prevent nausea.There are also some antinausea medicines that may help prevent nausea. HOME CARE INSTRUCTIONS   Take all medicine as directed by your caregiver.  If you do not have an appetite, do not force yourself to eat. However, you must continue to drink fluids.  If you have an appetite, eat a normal diet unless your caregiver tells you differently.  Eat a variety of complex carbohydrates (rice, wheat, potatoes, bread), lean meats, yogurt, fruits, and vegetables.  Avoid  high-fat foods because they are more difficult to digest.  Drink enough water and fluids to keep your urine clear or pale yellow.  If you are dehydrated, ask your caregiver for specific rehydration instructions. Signs of dehydration may include:  Severe thirst.  Dry lips and mouth.  Dizziness.  Dark urine.  Decreasing urine frequency and amount.  Confusion.  Rapid breathing or pulse. SEEK IMMEDIATE MEDICAL CARE IF:   You have blood or brown flecks (like coffee grounds) in your vomit.  You have black or bloody stools.  You have a severe headache or stiff neck.  You are confused.  You have severe abdominal pain.  You have chest pain or trouble breathing.  You do not urinate at least once every 8 hours.  You develop cold or clammy skin.  You continue to vomit for longer than 24 to 48 hours.  You have a fever. MAKE SURE YOU:   Understand these instructions.  Will watch your condition.  Will get help right away if you are not doing well or get worse.   This information is not intended to replace advice given to you by your health care provider. Make sure you discuss any questions you have with your health care provider.   Document Released: 07/28/2005 Document Revised: 10/20/2011 Document Reviewed: 12/25/2010 Elsevier Interactive Patient Education 2016 [employer]  Nonspecific Chest Pain  Chest pain can be caused by many different conditions. There is always a  chance that your pain could be related to something serious, such as a heart attack or a blood clot in your lungs. Chest pain can also be caused by conditions that are not life-threatening. If you have chest pain, it is very important to follow up with your health care provider. CAUSES  Chest pain can be caused by:  Heartburn.  Pneumonia or bronchitis.  Anxiety or stress.  Inflammation around your heart (pericarditis) or lung (pleuritis or pleurisy).  A blood clot in your lung.  A collapsed lung  (pneumothorax). It can develop suddenly on its own (spontaneous pneumothorax) or from trauma to the chest.  Shingles infection (varicella-zoster virus).  Heart attack.  Damage to the bones, muscles, and cartilage that make up your chest wall. This can include:  Bruised bones due to injury.  Strained muscles or cartilage due to frequent or repeated coughing or overwork.  Fracture to one or more ribs.  Sore cartilage due to inflammation (costochondritis). RISK FACTORS  Risk factors for chest pain may include:  Activities that increase your risk for trauma or injury to your chest.  Respiratory infections or conditions that cause frequent coughing.  Medical conditions or overeating that can cause heartburn.  Heart disease or family history of heart disease.  Conditions or health behaviors that increase your risk of developing a blood clot.  Having had chicken pox (varicella zoster). SIGNS AND SYMPTOMS Chest pain can feel like:  Burning or tingling on the surface of your chest or deep in your chest.  Crushing, pressure, aching, or squeezing pain.  Dull or sharp pain that is worse when you move, cough, or take a deep breath.  Pain that is also felt in your back, neck, shoulder, or arm, or pain that spreads to any of these areas. Your chest pain may come and go, or it may stay constant. DIAGNOSIS Lab tests or other studies may be needed to find the cause of your pain. Your health care provider may have you take a test called an ambulatory ECG (electrocardiogram). An ECG records your heartbeat patterns at the time the test is performed. You may also have other tests, such as:  Transthoracic echocardiogram (TTE). During echocardiography, sound waves are used to create a picture of all of the heart structures and to look at how blood flows through your heart.  Transesophageal echocardiogram (TEE).This is a more advanced imaging test that obtains images from inside your body. It  allows your health care provider to see your heart in finer detail.  Cardiac monitoring. This allows your health care provider to monitor your heart rate and rhythm in real time.  Holter monitor. This is a portable device that records your heartbeat and can help to diagnose abnormal heartbeats. It allows your health care provider to track your heart activity for several days, if needed.  Stress tests. These can be done through exercise or by taking medicine that makes your heart beat more quickly.  Blood tests.  Imaging tests. TREATMENT  Your treatment depends on what is causing your chest pain. Treatment may include:  Medicines. These may include:  Acid blockers for heartburn.  Anti-inflammatory medicine.  Pain medicine for inflammatory conditions.  Antibiotic medicine, if an infection is present.  Medicines to dissolve blood clots.  Medicines to treat coronary artery disease.  Supportive care for conditions that do not require medicines. This may include:  Resting.  Applying heat or cold packs to injured areas.  Limiting activities until pain decreases. HOME CARE INSTRUCTIONS  If you were prescribed an antibiotic medicine, finish it all even if you start to feel better.  Avoid any activities that bring on chest pain.  Do not use any tobacco products, including cigarettes, chewing tobacco, or electronic cigarettes. If you need help quitting, ask your health care provider.  Do not drink alcohol.  Take medicines only as directed by your health care provider.  Keep all follow-up visits as directed by your health care provider. This is important. This includes any further testing if your chest pain does not go away.  If heartburn is the cause for your chest pain, you may be told to keep your head raised (elevated) while sleeping. This reduces the chance that acid will go from your stomach into your esophagus.  Make lifestyle changes as directed by your health care  provider. These may include:  Getting regular exercise. Ask your health care provider to suggest some activities that are safe for you.  Eating a heart-healthy diet. A registered dietitian can help you to learn healthy eating options.  Maintaining a healthy weight.  Managing diabetes, if necessary.  Reducing stress. SEEK MEDICAL CARE IF:  Your chest pain does not go away after treatment.  You have a rash with blisters on your chest.  You have a fever. SEEK IMMEDIATE MEDICAL CARE IF:   Your chest pain is worse.  You have an increasing cough, or you cough up blood.  You have severe abdominal pain.  You have severe weakness.  You faint.  You have chills.  You have sudden, unexplained chest discomfort.  You have sudden, unexplained discomfort in your arms, back, neck, or jaw.  You have shortness of breath at any time.  You suddenly start to sweat, or your skin gets clammy.  You feel nauseous or you vomit.  You suddenly feel light-headed or dizzy.  Your heart begins to beat quickly, or it feels like it is skipping beats. These symptoms may represent a serious problem that is an emergency. Do not wait to see if the symptoms will go away. Get medical help right away. Call your local emergency services (911 in the [place] Do not drive yourself to the hospital.   This information is not intended to replace advice given to you by your health care provider. Make sure you discuss any questions you have with your health care provider.   Document Released: 05/07/2005 Document Revised: 08/18/2014 Document Reviewed: 03/03/2014 Elsevier Interactive Patient Education [employer]

## 2015-05-30 NOTE — ED Notes (Signed)
Patient left at this time with all belongings. 

## 2015-06-13 ENCOUNTER — Ambulatory Visit: Payer: 59 | Admitting: Internal Medicine

## 2015-06-30 ENCOUNTER — Emergency Department (HOSPITAL_COMMUNITY): Payer: Medicaid Other

## 2015-06-30 ENCOUNTER — Encounter (HOSPITAL_COMMUNITY): Payer: Self-pay

## 2015-06-30 ENCOUNTER — Inpatient Hospital Stay (HOSPITAL_COMMUNITY)
Admission: EM | Admit: 2015-06-30 | Discharge: 2015-07-04 | DRG: 781 | Disposition: A | Discharge status: Home / Self Care | Payer: Medicaid Other | Attending: Oncology | Admitting: Oncology

## 2015-06-30 ENCOUNTER — Inpatient Hospital Stay (HOSPITAL_COMMUNITY): Payer: Medicaid Other

## 2015-06-30 DIAGNOSIS — R1013 Epigastric pain: Secondary | ICD-10-CM | POA: Diagnosis present

## 2015-06-30 DIAGNOSIS — O99321 Drug use complicating pregnancy, first trimester: Secondary | ICD-10-CM | POA: Diagnosis present

## 2015-06-30 DIAGNOSIS — F1210 Cannabis abuse, uncomplicated: Secondary | ICD-10-CM | POA: Diagnosis present

## 2015-06-30 DIAGNOSIS — Z3A01 Less than 8 weeks gestation of pregnancy: Secondary | ICD-10-CM | POA: Diagnosis not present

## 2015-06-30 DIAGNOSIS — O210 Mild hyperemesis gravidarum: Secondary | ICD-10-CM | POA: Diagnosis present

## 2015-06-30 DIAGNOSIS — R1012 Left upper quadrant pain: Secondary | ICD-10-CM | POA: Diagnosis present

## 2015-06-30 DIAGNOSIS — R112 Nausea with vomiting, unspecified: Secondary | ICD-10-CM

## 2015-06-30 DIAGNOSIS — R1110 Vomiting, unspecified: Secondary | ICD-10-CM | POA: Insufficient documentation

## 2015-06-30 DIAGNOSIS — Z3490 Encounter for supervision of normal pregnancy, unspecified, unspecified trimester: Secondary | ICD-10-CM | POA: Insufficient documentation

## 2015-06-30 DIAGNOSIS — O219 Vomiting of pregnancy, unspecified: Secondary | ICD-10-CM | POA: Diagnosis not present

## 2015-06-30 DIAGNOSIS — I4891 Unspecified atrial fibrillation: Secondary | ICD-10-CM | POA: Diagnosis present

## 2015-06-30 DIAGNOSIS — O99611 Diseases of the digestive system complicating pregnancy, first trimester: Secondary | ICD-10-CM | POA: Diagnosis present

## 2015-06-30 DIAGNOSIS — O99411 Diseases of the circulatory system complicating pregnancy, first trimester: Secondary | ICD-10-CM | POA: Diagnosis not present

## 2015-06-30 DIAGNOSIS — Z87891 Personal history of nicotine dependence: Secondary | ICD-10-CM | POA: Diagnosis not present

## 2015-06-30 DIAGNOSIS — I499 Cardiac arrhythmia, unspecified: Secondary | ICD-10-CM | POA: Diagnosis present

## 2015-06-30 DIAGNOSIS — Z331 Pregnant state, incidental: Secondary | ICD-10-CM | POA: Diagnosis not present

## 2015-06-30 DIAGNOSIS — R109 Unspecified abdominal pain: Secondary | ICD-10-CM | POA: Diagnosis not present

## 2015-06-30 LAB — CBC WITH DIFFERENTIAL/PLATELET
BASOS ABS: 0 10*3/uL (ref 0.0–0.1)
BASOS PCT: 0 %
Eosinophils Absolute: 0 10*3/uL (ref 0.0–0.7)
Eosinophils Relative: 0 %
HEMATOCRIT: 36.5 % (ref 36.0–46.0)
HEMOGLOBIN: 12.5 g/dL (ref 12.0–15.0)
Lymphocytes Relative: 8 %
Lymphs Abs: 1 10*3/uL (ref 0.7–4.0)
MCH: 30.9 pg (ref 26.0–34.0)
MCHC: 34.2 g/dL (ref 30.0–36.0)
MCV: 90.3 fL (ref 78.0–100.0)
MONO ABS: 0.4 10*3/uL (ref 0.1–1.0)
Monocytes Relative: 4 %
NEUTROS ABS: 10.9 10*3/uL — AB (ref 1.7–7.7)
NEUTROS PCT: 88 %
Platelets: 273 10*3/uL (ref 150–400)
RBC: 4.04 MIL/uL (ref 3.87–5.11)
RDW: 13.8 % (ref 11.5–15.5)
WBC: 12.3 10*3/uL — ABNORMAL HIGH (ref 4.0–10.5)

## 2015-06-30 LAB — COMPREHENSIVE METABOLIC PANEL
ALBUMIN: 4.4 g/dL (ref 3.5–5.0)
ALT: 18 U/L (ref 14–54)
ANION GAP: 11 (ref 5–15)
AST: 20 U/L (ref 15–41)
Alkaline Phosphatase: 44 U/L (ref 38–126)
BUN: 6 mg/dL (ref 6–20)
CHLORIDE: 102 mmol/L (ref 101–111)
CO2: 26 mmol/L (ref 22–32)
Calcium: 10.1 mg/dL (ref 8.9–10.3)
Creatinine, Ser: 0.67 mg/dL (ref 0.44–1.00)
GFR calc Af Amer: >60 mL/min (ref >60)
GFR calc non Af Amer: >60 mL/min (ref >60)
GLUCOSE: 124 mg/dL — AB (ref 65–99)
POTASSIUM: 4.2 mmol/L (ref 3.5–5.1)
Sodium: 139 mmol/L (ref 135–145)
Total Bilirubin: 1.2 mg/dL (ref 0.3–1.2)
Total Protein: 8.1 g/dL (ref 6.5–8.1)

## 2015-06-30 LAB — URINALYSIS, ROUTINE W REFLEX MICROSCOPIC
BILIRUBIN URINE: NEGATIVE
Glucose, UA: NEGATIVE mg/dL
Hgb urine dipstick: NEGATIVE
Ketones, ur: >80 mg/dL — AB
LEUKOCYTES UA: NEGATIVE
NITRITE: NEGATIVE
PH: 5.5 (ref 5.0–8.0)
PROTEIN: 30 mg/dL — AB
SPECIFIC GRAVITY, URINE: 1.036 — AB (ref 1.005–1.030)

## 2015-06-30 LAB — POC URINE PREG, ED: Preg Test, Ur: POSITIVE — AB

## 2015-06-30 LAB — URINE MICROSCOPIC-ADD ON

## 2015-06-30 LAB — TSH: TSH: 0.55 u[IU]/mL (ref 0.350–4.500)

## 2015-06-30 LAB — I-STAT TROPONIN, ED: TROPONIN I, POC: 0 ng/mL (ref 0.00–0.08)

## 2015-06-30 LAB — HCG, QUANTITATIVE, PREGNANCY: HCG, BETA CHAIN, QUANT, S: 266 m[IU]/mL — AB (ref <5)

## 2015-06-30 MED ORDER — PROMETHAZINE HCL 25 MG/ML IJ SOLN
12.5000 mg | Freq: Four times a day (QID) | INTRAMUSCULAR | Status: DC | PRN
  Administered 2015-06-30: 12.5 mg via INTRAVENOUS
  Filled 2015-06-30: qty 1

## 2015-06-30 MED ORDER — METOPROLOL TARTRATE 1 MG/ML IV SOLN
5.0000 mg | Freq: Four times a day (QID) | INTRAVENOUS | Status: DC
  Administered 2015-06-30 (×2): 5 mg via INTRAVENOUS
  Filled 2015-06-30 (×3): qty 5

## 2015-06-30 MED ORDER — PROMETHAZINE HCL 25 MG/ML IJ SOLN
12.5000 mg | INTRAMUSCULAR | Status: DC | PRN
  Administered 2015-06-30 – 2015-07-04 (×11): 12.5 mg via INTRAVENOUS
  Filled 2015-06-30 (×13): qty 1

## 2015-06-30 MED ORDER — PROMETHAZINE HCL 25 MG/ML IJ SOLN
12.5000 mg | Freq: Once | INTRAMUSCULAR | Status: AC
  Administered 2015-06-30: 12.5 mg via INTRAVENOUS
  Filled 2015-06-30: qty 1

## 2015-06-30 MED ORDER — ONDANSETRON HCL 4 MG/2ML IJ SOLN
4.0000 mg | Freq: Once | INTRAMUSCULAR | Status: AC
  Administered 2015-06-30: 4 mg via INTRAVENOUS
  Filled 2015-06-30: qty 2

## 2015-06-30 MED ORDER — ENOXAPARIN SODIUM 40 MG/0.4ML ~~LOC~~ SOLN
40.0000 mg | SUBCUTANEOUS | Status: DC
  Administered 2015-06-30 – 2015-07-04 (×5): 40 mg via SUBCUTANEOUS
  Filled 2015-06-30 (×5): qty 0.4

## 2015-06-30 MED ORDER — ONDANSETRON HCL 4 MG/2ML IJ SOLN
4.0000 mg | Freq: Three times a day (TID) | INTRAMUSCULAR | Status: AC | PRN
  Administered 2015-06-30: 4 mg via INTRAVENOUS
  Filled 2015-06-30: qty 2

## 2015-06-30 MED ORDER — SODIUM CHLORIDE 0.9 % IV BOLUS (SEPSIS)
1000.0000 mL | Freq: Once | INTRAVENOUS | Status: AC
  Administered 2015-06-30: 1000 mL via INTRAVENOUS

## 2015-06-30 MED ORDER — ACETAMINOPHEN 325 MG PO TABS
650.0000 mg | ORAL_TABLET | Freq: Four times a day (QID) | ORAL | Status: DC | PRN
  Administered 2015-06-30 – 2015-07-01 (×2): 650 mg via ORAL
  Filled 2015-06-30 (×2): qty 2

## 2015-06-30 MED ORDER — PANTOPRAZOLE SODIUM 40 MG IV SOLR
40.0000 mg | Freq: Once | INTRAVENOUS | Status: AC
  Administered 2015-06-30: 40 mg via INTRAVENOUS
  Filled 2015-06-30: qty 40

## 2015-06-30 MED ORDER — ACETAMINOPHEN 650 MG RE SUPP: 650.0000 mg | Freq: Four times a day (QID) | RECTAL | Status: DC | PRN

## 2015-06-30 MED ORDER — PANTOPRAZOLE SODIUM 20 MG PO TBEC
20.0000 mg | DELAYED_RELEASE_TABLET | Freq: Every day | ORAL | Status: DC
  Administered 2015-06-30 – 2015-07-02 (×3): 20 mg via ORAL
  Filled 2015-06-30 (×4): qty 1

## 2015-06-30 MED ORDER — SODIUM CHLORIDE 0.9 % IV SOLN
INTRAVENOUS | Status: AC
  Administered 2015-06-30: 125 mL/h via INTRAVENOUS
  Administered 2015-07-01: 07:00:00 via INTRAVENOUS

## 2015-06-30 MED ORDER — METOPROLOL TARTRATE 1 MG/ML IV SOLN: 5.0000 mg | INTRAVENOUS | Status: DC | PRN

## 2015-06-30 NOTE — ED Notes (Signed)
Admitting dr at bedside.  

## 2015-06-30 NOTE — H&P (Signed)
Date: 06/30/2015               Patient Name:  Linda Koch MRN: [id number]  DOB: June 30, 1991 Age / Sex: 24 y.o., female   PCP: No primary care provider on file.         Medical Service: Internal Medicine Teaching Service         Attending Physician: Dr. Tyson Alias, MD    First Contact: Dr. Loney Loh  Pager: [phone]  Second Contact: Dr. Tasia Catchings Pager: [phone]       After Hours (After 5p/  First Contact Pager: [phone]  weekends / holidays): Second Contact Pager: [phone]   Chief Complaint: nausea and vomiting   History of Present Illness: Patient is a 24 year old [id number] with no significant past medical history presenting to [facility] with a history of nausea and vomiting for the past 2 days. Patient states she has not been able to keep anything down but still continues to make urine. Patient was vomiting when we were in the room. Also reports feeling dizzy, coughing, and having epigastric abdominal pain. Denies any loss of consciousness. Denies trying any new foods or recent sick contacts. Denies having any diarrhea. Reports having chills but no fever. States she had GERD during her previous pregnancy. Denies any cardiac history during pregnancy or otherwise. Denies any history of thyroid disorders. Denies taking any medications. Reports using marijuana 1-2 times per week, most recent use was yesterday.  In the ED, patient's beta hCG was found to be positive. Patient states she had her period last month and was due for her period around this time of the month but did not have it. States she is sexually active with one partner and is in a monogamous relationship. As per patient, she had a chlamydia infection in high school. Denies having any recent STI's. Denies noticing any vaginal discharge or vaginal pruritus. Denies having any dysuria, frequency, or urgency. Patient states she has been pregnant 4 times in the past - delivered a girl in August 2015 and had miscarriages  in 2014 and March 2016. Also reports having an abortion at the age of 24. Denies having any pregnancy related complications in the past.  Her PCP is Dr. Virl Son at [facility].   Meds: Current Facility-Administered Medications  Medication Dose Route Frequency Provider Last Rate Last Dose  . 0.9 %  sodium chloride infusion   Intravenous Continuous Tasrif Ahmed, MD 125 mL/hr at 06/30/15 1630 125 mL/hr at 06/30/15 1630  . acetaminophen (TYLENOL) tablet 650 mg  650 mg Oral Q6H PRN Tasrif Ahmed, MD       Or  . acetaminophen (TYLENOL) suppository 650 mg  650 mg Rectal Q6H PRN Tasrif Ahmed, MD      . enoxaparin (LOVENOX) injection 40 mg  40 mg Subcutaneous Q24H Tasrif Ahmed, MD   40 mg at 06/30/15 1627  . metoprolol (LOPRESSOR) injection 5 mg  5 mg Intravenous 4 times per day Hyacinth Meeker, MD   5 mg at 06/30/15 1626  . ondansetron (ZOFRAN) injection 4 mg  4 mg Intravenous Q8H PRN Jaime Pilcher Ward, PA-C      . pantoprazole (PROTONIX) EC tablet 20 mg  20 mg Oral Daily Tasrif Ahmed, MD   20 mg at 06/30/15 1645  . promethazine (PHENERGAN) injection 12.5 mg  12.5 mg Intravenous Q4H PRN Hyacinth Meeker, MD        Allergies: Allergies as of 06/30/2015  . (No Known Allergies)   Past  Medical History  Diagnosis Date  . Chlamydia    Past Surgical History  Procedure Laterality Date  . No past surgeries    . Cesarean section N/A 03/29/2014    Procedure: CESAREAN SECTION;  Surgeon: Loney LaurenceMichelle A Horvath, MD;  Location: [facility];  Service: Obstetrics;  Laterality: N/A;   Family History  Problem Relation Age of Onset  . Heart disease Maternal Grandfather    Social History   Social History  . Marital Status: Single    Spouse Name: N/A  . Number of Children: N/A  . Years of Education: N/A   Occupational History  . Not on file.   Social History Main Topics  . Smoking status: Former Games developermoker  . Smokeless tobacco: Never Used     Comment: quit iearly 2015  . Alcohol Use: Yes     Comment:  rare, last was Christmas  . Drug Use: Yes    Special: Marijuana     Comment: 2015  . Sexual Activity: Yes    Birth Control/ Protection: Condom, None   Other Topics Concern  . Not on file   Social History Narrative    Review of Systems: Review of Systems  Constitutional: Positive for chills. Negative for fever.  HENT: Negative for ear pain.   Eyes: Negative for blurred vision and pain.  Respiratory: Positive for cough. Negative for shortness of breath and wheezing.   Cardiovascular: Negative for chest pain and leg swelling.  Gastrointestinal: Positive for nausea, vomiting and abdominal pain. Negative for diarrhea and constipation.  Genitourinary: Negative for dysuria, urgency and frequency.  Musculoskeletal: Negative for myalgias and joint pain.  Skin: Negative for itching and rash.  Neurological: Positive for dizziness. Negative for sensory change, focal weakness, loss of consciousness and headaches.    Physical Exam: Blood pressure 142/90, pulse 70, temperature 98.6 F (37 C), temperature source Oral, resp. rate 16, height 5\' 3"  (1.6 m), weight 150 lb (68.04 kg), last menstrual period 05/28/2015, SpO2 100 %, not currently breastfeeding. Physical Exam  Constitutional: She is oriented to person, place, and time. She appears well-developed and well-nourished. She appears distressed.  Patient was vomiting.  HENT:  Head: Normocephalic and atraumatic.  Eyes: EOM are normal. Pupils are equal, round, and reactive to light.  Neck: Neck supple. No tracheal deviation present.  Cardiovascular: Intact distal pulses.   Tachycardic Irregular rhythm  Pulmonary/Chest: Effort normal and breath sounds normal. No respiratory distress. She has no wheezes. She has no rales.  Abdominal: Soft. Bowel sounds are normal. She exhibits no distension. There is tenderness. There is no rebound and no guarding.  Epigastric region mildly tender to palpation.  Musculoskeletal: She exhibits no edema.    Neurological: She is alert and oriented to person, place, and time.  Skin: Skin is warm and dry. She is not diaphoretic.    Lab results: Basic Metabolic Panel:  Recent Labs  78/29/5611/19/16 0600  NA 139  K 4.2  CL 102  CO2 26  GLUCOSE 124*  BUN 6  CREATININE 0.67  CALCIUM 10.1   Liver Function Tests:  Recent Labs  06/30/15 0600  AST 20  ALT 18  ALKPHOS 44  BILITOT 1.2  PROT 8.1  ALBUMIN 4.4   CBC:  Recent Labs  06/30/15 0600  WBC 12.3*  NEUTROABS 10.9*  HGB 12.5  HCT 36.5  MCV 90.3  PLT 273   Thyroid Function Tests:  Recent Labs  06/30/15 1400  TSH 0.550   Urinalysis:  Recent Labs  06/30/15 0600  COLORURINE YELLOW  LABSPEC 1.036*  PHURINE 5.5  GLUCOSEU NEGATIVE  HGBUR NEGATIVE  BILIRUBINUR NEGATIVE  KETONESUR >80*  PROTEINUR 30*  NITRITE NEGATIVE  LEUKOCYTESUR NEGATIVE   Imaging results:  US Ob Comp Less 14 Wks  06/30/2015  CLINICAL DATA:  24 year old with intractable nausea and vomiting. Positive urine pregnancy test. EXAM: OBSTETRIC <14 WK [place] AND TRANSVAGINAL OB US TECHNIQUE: Both transabdominal and transvaginal ultrasound examinations were performed for complete evaluation of the gestation as well as the maternal uterus, adnexal regions, and pelvic cul-de-sac. Transvaginal technique was performed to assess early pregnancy. COMPARISON:  10/12/2014 FINDINGS: Intrauterine gestational sac: None Maternal uterus/adnexae: Uterus is in a retroverted position. There is no evidence for an intrauterine gestational sac. Normal appearance of the endometrial stripe roughly measuring 3 mm. Normal appearance of the right ovary measuring 3.2 x 1.5 x 1.5 cm. Normal appearance of the left ovary measuring 3.4 x 1.9 x 2.8 cm. There is a round heterogeneous structure within the left ovary measuring up to 2.1 cm. Trace free fluid. IMPRESSION: No evidence for an intrauterine pregnancy. Cannot exclude an early pregnancy and recommend follow-up quantitative beta HCG levels.  Heterogeneous structure in the left ovary is nonspecific but could represent a corpus luteum. Electronically Signed   By: Richarda Overlie M.D.   On: 06/30/2015 10:56   US Ob Transvaginal  06/30/2015  CLINICAL DATA:  24 year old with intractable nausea and vomiting. Positive urine pregnancy test. EXAM: OBSTETRIC <14 WK [place] AND TRANSVAGINAL OB US TECHNIQUE: Both transabdominal and transvaginal ultrasound examinations were performed for complete evaluation of the gestation as well as the maternal uterus, adnexal regions, and pelvic cul-de-sac. Transvaginal technique was performed to assess early pregnancy. COMPARISON:  10/12/2014 FINDINGS: Intrauterine gestational sac: None Maternal uterus/adnexae: Uterus is in a retroverted position. There is no evidence for an intrauterine gestational sac. Normal appearance of the endometrial stripe roughly measuring 3 mm. Normal appearance of the right ovary measuring 3.2 x 1.5 x 1.5 cm. Normal appearance of the left ovary measuring 3.4 x 1.9 x 2.8 cm. There is a round heterogeneous structure within the left ovary measuring up to 2.1 cm. Trace free fluid. IMPRESSION: No evidence for an intrauterine pregnancy. Cannot exclude an early pregnancy and recommend follow-up quantitative beta HCG levels. Heterogeneous structure in the left ovary is nonspecific but could represent a corpus luteum. Electronically Signed   By: Richarda Overlie M.D.   On: 06/30/2015 10:56    Other results: EKG: A. fib and PVCs  Assessment & Plan by Problem: Principal Problem:   Atrial fibrillation with RVR (HCC) Active Problems:   Irregular cardiac rhythm   Nausea and vomiting during pregnancy   Mild tetrahydrocannabinol (THC) abuse  Atrial fibrillation with RVR Patient is a young lady with no significant past medical history presenting with new onset A. Fib. EKG showing atrial fibrillation and PVCs. She denies any previous cardiac history and denies any medication use. TSH normal. White count mildly  elevated at 12.3. Patient is also having intractable nausea and vomiting for the past 2 days which could explain her tachycardia. Heart monitor showing rate up to 150s while we were in the room (patient was vomiting).   -Admitted to telemetry -Metoprolol 5 mg IV every 6 hours for rate control -Follow-up UDS  Intractable nausea and vomiting Patient reports a 2 day history of nausea and vomiting. Pregnancy test positive and positive beta hCG (266). Urinalysis not showing any signs of infection. Her symptoms are likely in the setting of pregnancy. However,  she does report having cough, and epigastric abdominal pain, and as such, GERD is also on the differential. She was bolused with 2 L of IV fluid in the ED. -Normal saline @ 125 mL per hour -Outpatient OB/GYN follow-up -Pantoprazole 20 mg daily -Ondansetron 4 mg as needed for nausea/ vomiting -Promethazine 12.5 mg as needed for nausea/vomiting -Follow-up a.m. CBC -Follow-up a.m. CMP  DVT prophylaxis -Lovenox  Diet  -Heart healthy   Code: FULL  Dispo: Disposition is deferred at this time, awaiting improvement of current medical problems. Anticipated discharge in approximately 1-2 day(s).   The patient does have a current PCP (No primary care provider on file.) and does need an [facility] hospital follow-up appointment after discharge.  The patient does not have transportation limitations that hinder transportation to clinic appointments.  Signed: John Giovanni, MD 06/30/2015, 4:50 PM

## 2015-06-30 NOTE — Progress Notes (Signed)
  Echocardiogram 2D Echocardiogram has been performed.  Delcie RochENNINGTON, Anirudh Baiz 06/30/2015, 2:48 PM

## 2015-06-30 NOTE — ED Provider Notes (Signed)
CSN: [id number]     Arrival date & time 06/30/15  4098 History   First MD Initiated Contact with Patient 06/30/15 [phone]     Chief Complaint  Patient presents with  . Abdominal Pain     (Consider location/radiation/quality/duration/timing/severity/associated sxs/prior Treatment) Patient is a 24 y.o. female presenting with abdominal pain. The history is provided by the patient and medical records. No language interpreter was used.  Abdominal Pain Associated symptoms: nausea and vomiting   Associated symptoms: no chills, no constipation, no cough, no diarrhea, no fatigue, no fever, no shortness of breath, no sore throat, no vaginal bleeding and no vaginal discharge    Linda Koch is a 24 y.o. female  with no significant PMH who presents to the Emergency Department complaining of worsening, cramping lower and epigastric abdominal pain that began last night. She states she was treated for gastritis here a few weeks ago but did not take medication given. Admits to emesis after she eats or drinks anything, and states she has a metallic taste in mouth. LMP 10/17. Denies vaginal bleeding. No radiation of pain.    Past Medical History  Diagnosis Date  . Chlamydia    Past Surgical History  Procedure Laterality Date  . No past surgeries    . Cesarean section N/A 03/29/2014    Procedure: CESAREAN SECTION;  Surgeon: Loney Laurence, MD;  Location: [facility];  Service: Obstetrics;  Laterality: N/A;   Family History  Problem Relation Age of Onset  . Heart disease Maternal Grandfather    Social History  Substance Use Topics  . Smoking status: Former Games developer  . Smokeless tobacco: Never Used     Comment: quit iearly 2015  . Alcohol Use: Yes     Comment: rare, last was Christmas   OB History    Gravida Para Term Preterm AB TAB SAB Ectopic Multiple Living   Review of Systems  Constitutional: Negative for fever, chills, diaphoresis and fatigue.  HENT: Negative for  congestion, rhinorrhea and sore throat.   Eyes: Negative for visual disturbance.  Respiratory: Negative for cough, shortness of breath and wheezing.   Cardiovascular: Negative.   Gastrointestinal: Positive for nausea, vomiting and abdominal pain. Negative for diarrhea and constipation.  Genitourinary: Negative for vaginal bleeding, vaginal discharge and vaginal pain.  Musculoskeletal: Negative for myalgias, back pain, arthralgias and neck pain.  Skin: Negative for rash.  Neurological: Negative for dizziness, weakness and headaches.      Allergies  Review of patient's allergies indicates no known allergies.  Home Medications   Prior to Admission medications   Medication Sig Start Date End Date Taking? Authorizing Provider  ondansetron (ZOFRAN ODT) 4 MG disintegrating tablet  ODT q4 hours prn nausea/vomit Patient taking differently: Take 4 mg by mouth every 4 (four) hours as needed for nausea or vomiting.  05/30/15  Yes Loren Racer, MD  pantoprazole (PROTONIX) 20 MG tablet Take 1 tablet (20 mg total) by mouth daily. 05/30/15  Yes Loren Racer, MD   BP 113/74 mmHg  Pulse 124  Temp(Src) 98.6 F (37 C) (Oral)  Resp 19  Ht  (1.6 m)  Wt 150 lb (68.04 kg)  BMI 26.58 kg/m2  SpO2 97%  LMP 05/28/2015 Physical Exam  Constitutional: She is oriented to person, place, and time. She appears well-developed and well-nourished.  Appears in pain but in no acute distress  HENT:  Head: Normocephalic and atraumatic.  Cardiovascular:  Normal rate, regular rhythm, normal heart sounds and intact distal pulses.  Exam reveals no gallop and no friction rub.   No murmur heard. Pulmonary/Chest: Effort normal and breath sounds normal. No respiratory distress. She has no wheezes. She has no rales. She exhibits no tenderness.  Abdominal: She exhibits no mass. There is no hepatosplenomegaly. There is no rebound, no guarding and no CVA tenderness.  Abdomen soft, non-distended Tender to  palpation of epigastrium, LUQ Bowel sounds positive in all four quadrants  Musculoskeletal: She exhibits no edema.  Neurological: She is alert and oriented to person, place, and time.  Skin: Skin is warm and dry. No rash noted.  Psychiatric: She has a normal mood and affect. Her behavior is normal. Judgment and thought content normal.  Nursing note and vitals reviewed.   ED Course  Procedures (including critical care time) Labs Review Labs Reviewed  CBC WITH DIFFERENTIAL/PLATELET - Abnormal; Notable for the following:    WBC 12.3 (*)    Neutro Abs 10.9 (*)    All other components within normal limits  COMPREHENSIVE METABOLIC PANEL - Abnormal; Notable for the following:    Glucose, Bld 124 (*)    All other components within normal limits  URINALYSIS, ROUTINE W REFLEX MICROSCOPIC (NOT AT [facility]) - Abnormal; Notable for the following:    APPearance CLOUDY (*)    Specific Gravity, Urine 1.036 (*)    Ketones, ur >80 (*)    Protein, ur 30 (*)    All other components within normal limits  URINE MICROSCOPIC-ADD ON - Abnormal; Notable for the following:    Squamous Epithelial / LPF 6-30 (*)    Bacteria, UA RARE (*)    All other components within normal limits  HCG, QUANTITATIVE, PREGNANCY - Abnormal; Notable for the following:    hCG, Beta Chain, Quant, S 266 (*)    All other components within normal limits  POC URINE PREG, ED - Abnormal; Notable for the following:    Preg Test, Ur POSITIVE (*)    All other components within normal limits  I-STAT TROPOININ, ED  I-STAT TROPOININ, ED    Imaging Review US Ob Comp Less 14 Wks  06/30/2015  CLINICAL DATA:  24 year old with intractable nausea and vomiting. Positive urine pregnancy test. EXAM: OBSTETRIC <14 WK [place] AND TRANSVAGINAL OB US TECHNIQUE: Both transabdominal and transvaginal ultrasound examinations were performed for complete evaluation of the gestation as well as the maternal uterus, adnexal regions, and pelvic cul-de-sac.  Transvaginal technique was performed to assess early pregnancy. COMPARISON:  10/12/2014 FINDINGS: Intrauterine gestational sac: None Maternal uterus/adnexae: Uterus is in a retroverted position. There is no evidence for an intrauterine gestational sac. Normal appearance of the endometrial stripe roughly measuring 3 mm. Normal appearance of the right ovary measuring 3.2 x 1.5 x 1.5 cm. Normal appearance of the left ovary measuring 3.4 x 1.9 x 2.8 cm. There is a round heterogeneous structure within the left ovary measuring up to 2.1 cm. Trace free fluid. IMPRESSION: No evidence for an intrauterine pregnancy. Cannot exclude an early pregnancy and recommend follow-up quantitative beta HCG levels. Heterogeneous structure in the left ovary is nonspecific but could represent a corpus luteum. Electronically Signed   By: Richarda Overlie M.D.   On: 06/30/2015 10:56   US Ob Transvaginal  06/30/2015  CLINICAL DATA:  24 year old with intractable nausea and vomiting. Positive urine pregnancy test. EXAM: OBSTETRIC <14 WK [place] AND TRANSVAGINAL OB US TECHNIQUE: Both transabdominal and transvaginal ultrasound examinations were performed for complete evaluation  of the gestation as well as the maternal uterus, adnexal regions, and pelvic cul-de-sac. Transvaginal technique was performed to assess early pregnancy. COMPARISON:  10/12/2014 FINDINGS: Intrauterine gestational sac: None Maternal uterus/adnexae: Uterus is in a retroverted position. There is no evidence for an intrauterine gestational sac. Normal appearance of the endometrial stripe roughly measuring 3 mm. Normal appearance of the right ovary measuring 3.2 x 1.5 x 1.5 cm. Normal appearance of the left ovary measuring 3.4 x 1.9 x 2.8 cm. There is a round heterogeneous structure within the left ovary measuring up to 2.1 cm. Trace free fluid. IMPRESSION: No evidence for an intrauterine pregnancy. Cannot exclude an early pregnancy and recommend follow-up quantitative beta HCG levels.  Heterogeneous structure in the left ovary is nonspecific but could represent a corpus luteum. Electronically Signed   By: Richarda OverlieAdam  Henn M.D.   On: 06/30/2015 10:56   I have personally reviewed and evaluated these images and lab results as part of my medical decision-making.   EKG Interpretation   Date/Time:  Saturday June 30 2015 09:23:39 EST Ventricular Rate:  90 PR Interval:  125 QRS Duration: 79 QT Interval:  358 QTC Calculation: 438 R Axis:   68 Text Interpretation:  Sinus rhythm No significant change since last  tracing Reconfirmed by ZACKOWSKI  MD, SCOTT [phone]([id number]) on 06/30/2015 9:37:15  AM      MDM   Final diagnoses:  Irregular cardiac rhythm  Nausea and vomiting, vomiting of unspecified type   Linda Koch presents with lower abdominal cramping and epigastric pain.   Pt. Was seen for for likely gastritis on 05/29/2015-Normal RUQ ultrasound and abdominal plain film at that time. Pt. States she has an upcoming appointment scheduled with GI but has not been taken rx given in ED for this.  Admits to metallic taste in mouth. Epigastric tenderness and LUQ tenderness likely 2/2 reflux. Will give protonix and reassess pain.   + upreg; LMP 10/17 - Patient informed of pregnancy results; no vaginal bleeding or discharge, no lower abdominal tenderness to exam.  UA with high spec gravity and ketones. Pt. Has had multiple episodes of NBNB emesis. 1L fluid bolus  8:50 AM - Patient re-evaluated and states pain, nausea has improved. However, she is still not able to keep fluids down.  SBP decreased by 20, pulse increased by 9 with orthostatic VS, still slightly tacky and continuing to vomiting. Another L of fluids and will reassess.   OB US shows no evidence of IUP, cannot exclude early pregnancy. Will get quantitative bhcg levels and recommend follow up with OB to re-check levels and for further eval and management of symptoms.   11:37 AM - Patient with increased tachycardia, seen by  Dr. Glenis SmokerZaclowski. Pt. Still has not urinated, starting 3rd L of fluid  1:09 PM - Patient continuing to be tachycardic; abnl rhythm on repeat ekg's; will consult for admission.   1:47 PM - Spoke with internal medicine who will admit   Patient seen by and discussed with Dr. Glenis SmokerZaclowski who agrees with treatment plan.      [facility] LLCJaime Pilcher Ward, PA-C 06/30/15 1347  Vanetta MuldersScott Zackowski, MD 06/30/15 [phone]

## 2015-06-30 NOTE — ED Notes (Signed)
Pt states she started having a cramping pain in abdomen with n/v on last evening; pt presents tearful and in fetal position pt states she has vomited over 24 times in last 24 hours; pt denies diarrhea; pt a&ox 4 on arrival. Pt states Oct 17th was last mensae and it is possible she could be pregnant

## 2015-06-30 NOTE — ED Notes (Signed)
Pt EKG showing Afib - hr 110-150bpm. Pt reports feeling shaky and like heart is fluttering. Dr. Deretha EmoryZackowski aware. No new orders at this time.

## 2015-06-30 NOTE — ED Notes (Signed)
Repeat EKG given to KeystoneJamie, GeorgiaPA-- to give to Dr. Deretha EmoryZackowski.

## 2015-06-30 NOTE — ED Notes (Signed)
2-d-echo at bedside. Report given to Bosie ClosJudith, RN on 3W.

## 2015-07-01 ENCOUNTER — Inpatient Hospital Stay (HOSPITAL_COMMUNITY): Payer: Medicaid Other

## 2015-07-01 DIAGNOSIS — R112 Nausea with vomiting, unspecified: Secondary | ICD-10-CM

## 2015-07-01 DIAGNOSIS — R1110 Vomiting, unspecified: Secondary | ICD-10-CM | POA: Insufficient documentation

## 2015-07-01 LAB — CBC WITH DIFFERENTIAL/PLATELET
Basophils Absolute: 0 10*3/uL (ref 0.0–0.1)
Basophils Relative: 0 %
EOS PCT: 0 %
Eosinophils Absolute: 0.1 10*3/uL (ref 0.0–0.7)
HCT: 35.3 % — ABNORMAL LOW (ref 36.0–46.0)
Hemoglobin: 11.9 g/dL — ABNORMAL LOW (ref 12.0–15.0)
LYMPHS ABS: 2.4 10*3/uL (ref 0.7–4.0)
LYMPHS PCT: 18 %
MCH: 30.4 pg (ref 26.0–34.0)
MCHC: 33.7 g/dL (ref 30.0–36.0)
MCV: 90.3 fL (ref 78.0–100.0)
MONO ABS: 0.8 10*3/uL (ref 0.1–1.0)
MONOS PCT: 6 %
Neutro Abs: 10.3 10*3/uL — ABNORMAL HIGH (ref 1.7–7.7)
Neutrophils Relative %: 76 %
PLATELETS: 286 10*3/uL (ref 150–400)
RBC: 3.91 MIL/uL (ref 3.87–5.11)
RDW: 13.9 % (ref 11.5–15.5)
WBC: 13.5 10*3/uL — AB (ref 4.0–10.5)

## 2015-07-01 LAB — COMPREHENSIVE METABOLIC PANEL
ALT: 16 U/L (ref 14–54)
AST: 18 U/L (ref 15–41)
Albumin: 3.6 g/dL (ref 3.5–5.0)
Alkaline Phosphatase: 37 U/L — ABNORMAL LOW (ref 38–126)
Anion gap: 8 (ref 5–15)
CHLORIDE: 106 mmol/L (ref 101–111)
CO2: 24 mmol/L (ref 22–32)
Calcium: 8.9 mg/dL (ref 8.9–10.3)
Creatinine, Ser: 0.68 mg/dL (ref 0.44–1.00)
GFR calc Af Amer: >60 mL/min (ref >60)
GLUCOSE: 90 mg/dL (ref 65–99)
POTASSIUM: 3.6 mmol/L (ref 3.5–5.1)
Sodium: 138 mmol/L (ref 135–145)
Total Bilirubin: 1.3 mg/dL — ABNORMAL HIGH (ref 0.3–1.2)
Total Protein: 6.9 g/dL (ref 6.5–8.1)

## 2015-07-01 LAB — RAPID URINE DRUG SCREEN, HOSP PERFORMED
AMPHETAMINES: NOT DETECTED
Barbiturates: NOT DETECTED
Benzodiazepines: NOT DETECTED
Cocaine: NOT DETECTED
Opiates: NOT DETECTED
Tetrahydrocannabinol: POSITIVE — AB

## 2015-07-01 MED ORDER — ONDANSETRON HCL 4 MG/2ML IJ SOLN
4.0000 mg | INTRAMUSCULAR | Status: DC | PRN
  Administered 2015-07-02 – 2015-07-03 (×2): 4 mg via INTRAVENOUS
  Filled 2015-07-01 (×2): qty 2

## 2015-07-01 MED ORDER — ONDANSETRON HCL 4 MG/2ML IJ SOLN
4.0000 mg | Freq: Once | INTRAMUSCULAR | Status: AC
  Administered 2015-07-01: 4 mg via INTRAVENOUS
  Filled 2015-07-01: qty 2

## 2015-07-01 MED ORDER — ONDANSETRON HCL 4 MG/2ML IJ SOLN: 4.0000 mg | Freq: Once | INTRAMUSCULAR | Status: DC

## 2015-07-01 MED ORDER — ASPIRIN 81 MG PO CHEW: 81.0000 mg | CHEWABLE_TABLET | Freq: Every day | ORAL | Status: DC

## 2015-07-01 MED ORDER — ASPIRIN EC 81 MG PO TBEC
81.0000 mg | DELAYED_RELEASE_TABLET | Freq: Every day | ORAL | Status: DC
  Administered 2015-07-01 – 2015-07-04 (×4): 81 mg via ORAL
  Filled 2015-07-01 (×4): qty 1

## 2015-07-01 MED ORDER — SODIUM CHLORIDE 0.9 % IV SOLN
INTRAVENOUS | Status: AC
Start: 2015-07-01 — End: 2015-07-02
  Administered 2015-07-01 (×2): 125 mL/h via INTRAVENOUS

## 2015-07-01 NOTE — Progress Notes (Signed)
Patient converted to SR/junctional around 0033. She running in Sr with some junctional beats. Pt also had a very large emesis about 20 min ago. MD on call notified of the above. Will continue to monitor.

## 2015-07-01 NOTE — Progress Notes (Signed)
Pt has requested to hold off getting weighed at this time because she is feeling so sick. She has agreed to get weighed when she gets up to go to the bathroom.

## 2015-07-01 NOTE — Progress Notes (Signed)
Subjective:  Patient actively vomiting when we entered the room. States she stopped vomitting last night and was able to get some sleep but this morning started vomitting again. Could not keep down her breakfast. Has some abdominal pain associated with the vomitting. No cp, sob, fever/chills.   Objective: Vital signs in last 24 hours: Filed Vitals:   06/30/15 2005 06/30/15 2240 06/30/15 2340 07/01/15 0424  BP: 135/91 132/97 128/109 128/83  Pulse:  110 98 78  Temp: 98.8 F (37.1 C)  98.7 F (37.1 C) 98.8 F (37.1 C)  TempSrc: Oral  Oral Oral  Resp: Height:      Weight:      SpO2: 100% 98%  100%   Weight change: 15 lb (6.804 kg)  Intake/Output Summary (Last 24 hours) at 07/01/15 0956 Last data filed at 07/01/15 0817  Gross per 24 hour  Intake 3769.5 ml  Output   2375 ml  Net 1394.5 ml   Vitals reviewed. General: having active emesis (nonbloody,nonbilious), distressed HEENT: PERRL, EOMI, no scleral icterus Cardiac: RRR, no rubs, murmurs or gallops Pulm: clear to auscultation bilaterally, no wheezes, rales, or rhonchi Abd: soft, nontender, nondistended Ext: warm and well perfused, no pedal edema Neuro: alert and oriented X3,  Lab Results: Basic Metabolic Panel:  Recent Labs Lab 06/30/15 0600 07/01/15 0331  NA 139 138  K 4.2 3.6  CL 102 106  CO2 26 24  GLUCOSE 124* 90  BUN 6 <5*  CREATININE 0.67 0.68  CALCIUM 10.1 8.9   Liver Function Tests:  Recent Labs Lab 06/30/15 0600 07/01/15 0331  AST 20 18  ALT 18 16  ALKPHOS 44 37*  BILITOT 1.2 1.3*  PROT 8.1 6.9  ALBUMIN 4.4 3.6   No results for input(s): LIPASE, AMYLASE in the last 168 hours. No results for input(s): AMMONIA in the last 168 hours. CBC:  Recent Labs Lab 06/30/15 0600 07/01/15 0331  WBC 12.3* 13.5*  NEUTROABS 10.9* 10.3*  HGB 12.5 11.9*  HCT 36.5 35.3*  MCV 90.3 90.3  PLT 273 286   Thyroid Function Tests:  Recent Labs Lab 06/30/15 1400  TSH 0.550   Urine  Drug Screen: Drugs of Abuse     Component Value Date/Time   LABOPIA NONE DETECTED 06/30/2015 2340   COCAINSCRNUR NONE DETECTED 06/30/2015 2340   LABBENZ NONE DETECTED 06/30/2015 2340   AMPHETMU NONE DETECTED 06/30/2015 2340   THCU POSITIVE* 06/30/2015 2340   LABBARB NONE DETECTED 06/30/2015 2340    Alcohol Level: No results for input(s): ETH in the last 168 hours. Urinalysis:  Recent Labs Lab 06/30/15 0600  COLORURINE YELLOW  LABSPEC 1.036*  PHURINE 5.5  GLUCOSEU NEGATIVE  HGBUR NEGATIVE  BILIRUBINUR NEGATIVE  KETONESUR >80*  PROTEINUR 30*  NITRITE NEGATIVE  LEUKOCYTESUR NEGATIVE   Misc. Labs:  Micro Results: No results found for this or any previous visit (from the past 240 hour(s)). Studies/Results: US Ob Comp Less 14 Wks  06/30/2015  CLINICAL DATA:  24 year old with intractable nausea and vomiting. Positive urine pregnancy test. EXAM: OBSTETRIC <14 WK [place] AND TRANSVAGINAL OB US TECHNIQUE: Both transabdominal and transvaginal ultrasound examinations were performed for complete evaluation of the gestation as well as the maternal uterus, adnexal regions, and pelvic cul-de-sac. Transvaginal technique was performed to assess early pregnancy. COMPARISON:  10/12/2014 FINDINGS: Intrauterine gestational sac: None Maternal uterus/adnexae: Uterus is in a retroverted position. There is no evidence for an intrauterine gestational sac. Normal appearance of the endometrial stripe roughly measuring  3 mm. Normal appearance of the right ovary measuring 3.2 x 1.5 x 1.5 cm. Normal appearance of the left ovary measuring 3.4 x 1.9 x 2.8 cm. There is a round heterogeneous structure within the left ovary measuring up to 2.1 cm. Trace free fluid. IMPRESSION: No evidence for an intrauterine pregnancy. Cannot exclude an early pregnancy and recommend follow-up quantitative beta HCG levels. Heterogeneous structure in the left ovary is nonspecific but could represent a corpus luteum. Electronically Signed    By: Richarda OverlieAdam  Henn M.D.   On: 06/30/2015 10:56   [place] Ob Transvaginal  06/30/2015  CLINICAL DATA:  24 year old with intractable nausea and vomiting. Positive urine pregnancy test. EXAM: OBSTETRIC <14 WK US AND TRANSVAGINAL OB US TECHNIQUE: Both transabdominal and transvaginal ultrasound examinations were performed for complete evaluation of the gestation as well as the maternal uterus, adnexal regions, and pelvic cul-de-sac. Transvaginal technique was performed to assess early pregnancy. COMPARISON:  10/12/2014 FINDINGS: Intrauterine gestational sac: None Maternal uterus/adnexae: Uterus is in a retroverted position. There is no evidence for an intrauterine gestational sac. Normal appearance of the endometrial stripe roughly measuring 3 mm. Normal appearance of the right ovary measuring 3.2 x 1.5 x 1.5 cm. Normal appearance of the left ovary measuring 3.4 x 1.9 x 2.8 cm. There is a round heterogeneous structure within the left ovary measuring up to 2.1 cm. Trace free fluid. IMPRESSION: No evidence for an intrauterine pregnancy. Cannot exclude an early pregnancy and recommend follow-up quantitative beta HCG levels. Heterogeneous structure in the left ovary is nonspecific but could represent a corpus luteum. Electronically Signed   By: Richarda OverlieAdam  Henn M.D.   On: 06/30/2015 10:56   Medications: I have reviewed the patient's current medications. Scheduled Meds: . aspirin EC  81 mg Oral Daily  . enoxaparin (LOVENOX) injection  40 mg Subcutaneous Q24H  . pantoprazole  20 mg Oral Daily   Continuous Infusions: . sodium chloride     PRN Meds:.acetaminophen **OR** acetaminophen, metoprolol, ondansetron (ZOFRAN) IV, promethazine Assessment/Plan: Principal Problem:   Atrial fibrillation with RVR (HCC) Active Problems:   Irregular cardiac rhythm   Nausea and vomiting during pregnancy   Mild tetrahydrocannabinol (THC) abuse   Emesis  24 yo G5T1P0A3L1 last LMP 1 month ago, UPT+, BCG in 200's, no confirmed intrauterine  pregnancy on u/s here with intractible n/v and also new onset AFib RVR.  New onset Afib RVR - likely incited intractable vomitting. ECHO was done and it was normal. HR was in 130-140's on admission and she received IV metoprolol yesterday. This morning she converted to NSR. CHADSVASC2 score of 1 (for female gender).  - hold metop for now. Has PRN metop for sustained HR >120. - started asa 7581 - will need f/up with high risk pregnancy practice.  Intractable vomitting during pregnancy - likely 2/2 to hyperemesis gravidarum vs cannabinoid hyperemesis syndrome  - cont zofran + phenergan PRN - liquid diet as tolerated - cont IVF - warm shower when able to tolerate - asked to quit THC use.  Current pregnancy - UPT+, beta hcg 266 (would correlate with 2-[redacted] week gestation), LMP 1 month ago.  Trans vaginal u/s could not confirm intrauterine pregnancy. Low likelyhood of Ectopic (no significant abdominal pain or mass identified on u/s. Will follow algorithm for ectopic pregnancy rule out: recheck Beta hcg + repeat TV u/s tomorrow. - will need OB follow up.    Dispo: Disposition is deferred at this time, awaiting improvement of current medical problems.  Anticipated discharge in approximately 1-2  day(s).   The patient does have a current PCP (No primary care provider on file.) and does need an [facility] hospital follow-up appointment after discharge.  The patient does have transportation limitations that hinder transportation to clinic appointments.  .Services Needed at time of discharge: Y = Yes, Blank = No PT:   OT:   RN:   Equipment:   Other:     LOS: 1 day   Hyacinth Meeker, MD 07/01/2015, 9:56 AM

## 2015-07-02 LAB — CBC
HCT: 36 % (ref 36.0–46.0)
Hemoglobin: 12.5 g/dL (ref 12.0–15.0)
MCH: 31.1 pg (ref 26.0–34.0)
MCHC: 34.7 g/dL (ref 30.0–36.0)
MCV: 89.6 fL (ref 78.0–100.0)
Platelets: 318 10*3/uL (ref 150–400)
RBC: 4.02 MIL/uL (ref 3.87–5.11)
RDW: 13.4 % (ref 11.5–15.5)
WBC: 11.9 10*3/uL — ABNORMAL HIGH (ref 4.0–10.5)

## 2015-07-02 LAB — BASIC METABOLIC PANEL
ANION GAP: 10 (ref 5–15)
CALCIUM: 9.3 mg/dL (ref 8.9–10.3)
CO2: 24 mmol/L (ref 22–32)
CREATININE: 0.59 mg/dL (ref 0.44–1.00)
Chloride: 103 mmol/L (ref 101–111)
GFR calc Af Amer: >60 mL/min (ref >60)
GLUCOSE: 82 mg/dL (ref 65–99)
Potassium: 3.3 mmol/L — ABNORMAL LOW (ref 3.5–5.1)
Sodium: 137 mmol/L (ref 135–145)

## 2015-07-02 LAB — MAGNESIUM: MAGNESIUM: 1.8 mg/dL (ref 1.7–2.4)

## 2015-07-02 LAB — HCG, QUANTITATIVE, PREGNANCY: HCG, BETA CHAIN, QUANT, S: 318 m[IU]/mL — AB (ref <5)

## 2015-07-02 MED ORDER — PANTOPRAZOLE SODIUM 40 MG PO TBEC
40.0000 mg | DELAYED_RELEASE_TABLET | Freq: Every day | ORAL | Status: DC
  Administered 2015-07-03 – 2015-07-04 (×2): 40 mg via ORAL
  Filled 2015-07-02 (×2): qty 1

## 2015-07-02 MED ORDER — POTASSIUM CHLORIDE CRYS ER 20 MEQ PO TBCR
40.0000 meq | EXTENDED_RELEASE_TABLET | Freq: Once | ORAL | Status: AC
  Administered 2015-07-02: 40 meq via ORAL
  Filled 2015-07-02: qty 2

## 2015-07-02 MED ORDER — SODIUM CHLORIDE 0.9 % IV SOLN
INTRAVENOUS | Status: AC
  Administered 2015-07-02 (×2): via INTRAVENOUS

## 2015-07-02 MED ORDER — POTASSIUM CHLORIDE 10 MEQ/100ML IV SOLN
10.0000 meq | INTRAVENOUS | Status: AC
  Administered 2015-07-02 (×2): 10 meq via INTRAVENOUS
  Filled 2015-07-02 (×2): qty 100

## 2015-07-02 NOTE — Plan of Care (Signed)
Problem: Education: Goal: Knowledge of [facility] Education information/materials will improve Outcome: Progressing Education provided on preventing and treating nausea, encouraged small frequent intake of bland food.  Problem: Safety: Goal: Ability to remain free from injury will improve Outcome: Completed/Met Date Met:  07/02/15 Pt low fall risk, compliant with use of call light to request assistance when getting up.  Problem: Fluid Volume: Goal: Ability to maintain a balanced intake and output will improve Outcome: Progressing Able to tolerate oral medications this morning.

## 2015-07-02 NOTE — Progress Notes (Signed)
Subjective:  Patient is still vomiting and not able to tolerate solid foods. Has some epigastric and LUQ abdominal pain/ burning associated with the vomitting. No cp, sob, fever/chills. Reports having orthostatic symptoms/ dizziness.    Objective: Vital signs in last 24 hours: Filed Vitals:   07/02/15 0000 07/02/15 0410 07/02/15 1425 07/02/15 1500  BP: 145/89 139/88 132/92   Pulse: 71 69 69   Temp: 98.9 F (37.2 C) 98.5 F (36.9 C) 98.3 F (36.8 C) 98.9 F (37.2 C)  TempSrc: Oral Oral Oral Oral  Resp: 18 16 18 18   Height:      Weight:  136 lb 0.4 oz (61.7 kg)    SpO2: 99% 100% 100% 100%   Weight change: -23 lb 15.6 oz (-10.876 kg)  Intake/Output Summary (Last 24 hours) at 07/02/15 1600 Last data filed at 07/02/15 1500  Gross per 24 hour  Intake 3371.25 ml  Output   5150 ml  Net -1778.75 ml   Vitals reviewed. Cardiac: RRR, no rubs, murmurs or gallops Pulm: clear to auscultation bilaterally, no wheezes, rales, or rhonchi Abd: Soft, nondistended, +BS. Abdomen mildly tender to palpation in the epigastric and LUQ regions. No rebound, guarding, or rigidity.  Ext: warm and well perfused, no pedal edema Neuro: alert and oriented X3,  Lab Results: Basic Metabolic Panel:  Recent Labs Lab 07/01/15 0331 07/02/15 0434  NA 138 137  K 3.6 3.3*  CL 106 103  CO2 24 24  GLUCOSE 90 82  BUN <5* <5*  CREATININE 0.68 0.59  CALCIUM 8.9 9.3  MG  --  1.8   Liver Function Tests:  Recent Labs Lab 06/30/15 0600 07/01/15 0331  AST 20 18  ALT 18 16  ALKPHOS 44 37*  BILITOT 1.2 1.3*  PROT 8.1 6.9  ALBUMIN 4.4 3.6   CBC:  Recent Labs Lab 06/30/15 0600 07/01/15 0331 07/02/15 0434  WBC 12.3* 13.5* 11.9*  NEUTROABS 10.9* 10.3*  --   HGB 12.5 11.9* 12.5  HCT 36.5 35.3* 36.0  MCV 90.3 90.3 89.6  PLT 273 286 318   Thyroid Function Tests:  Recent Labs Lab 06/30/15 1400  TSH 0.550   Urine Drug Screen: Drugs of Abuse     Component Value Date/Time   LABOPIA NONE  DETECTED 06/30/2015 2340   COCAINSCRNUR NONE DETECTED 06/30/2015 2340   LABBENZ NONE DETECTED 06/30/2015 2340   AMPHETMU NONE DETECTED 06/30/2015 2340   THCU POSITIVE* 06/30/2015 2340   LABBARB NONE DETECTED 06/30/2015 2340    Urinalysis:  Recent Labs Lab 06/30/15 0600  COLORURINE YELLOW  LABSPEC 1.036*  PHURINE 5.5  GLUCOSEU NEGATIVE  HGBUR NEGATIVE  BILIRUBINUR NEGATIVE  KETONESUR >80*  PROTEINUR 30*  NITRITE NEGATIVE  LEUKOCYTESUR NEGATIVE   Misc. Labs:  Micro Results: No results found for this or any previous visit (from the past 240 hour(s)). Studies/Results: No results found. Medications: I have reviewed the patient's current medications. Scheduled Meds: . aspirin EC  81 mg Oral Daily  . enoxaparin (LOVENOX) injection  40 mg Subcutaneous Q24H  . [START ON 07/03/2015] pantoprazole  40 mg Oral Daily   Continuous Infusions: . sodium chloride 125 mL/hr at 07/02/15 1433   PRN Meds:.acetaminophen **OR** acetaminophen, metoprolol, ondansetron (ZOFRAN) IV, promethazine Assessment/Plan: Principal Problem:   Atrial fibrillation with RVR (HCC) Active Problems:   Irregular cardiac rhythm   Nausea and vomiting during pregnancy   Mild tetrahydrocannabinol (THC) abuse   Emesis  24 yo G5T1P0A3L1 last LMP 1 month ago, UPT+, BCG in 200's, no  confirmed intrauterine pregnancy on u/s here with intractible n/v and also new onset AFib RVR.  New onset Afib RVR - likely incited intractable vomitting. ECHO was done and it was normal. HR was in 130-140's on admission and she received IV metoprolol yesterday. Currently in normal sinus rhythm.  CHADSVASC2 score of 1 (for female gender).  - hold metop for now. Has PRN metop for sustained HR >120. - started asa 81 - will need f/up with high risk pregnancy practice.  Intractable vomitting during pregnancy - likely 2/2 to hyperemesis gravidarum vs cannabinoid hyperemesis syndrome. She does endorse GERD symptoms.  - cont zofran +  phenergan PRN - Protonix 40 mg daily  - liquid diet as tolerated - cont IVF because orthostatic vitals positive  - warm shower when able to tolerate - asked to quit THC use.  Current pregnancy - UPT+, beta hcg 266 (would correlate with 2-[redacted] week gestation), LMP 1 month ago.  Trans vaginal u/s could not confirm intrauterine pregnancy. Low likelyhood of Ectopic - no significant abdominal pain or mass identified on u/s. Will follow algorithm for ectopic pregnancy rule out: recheck Beta hcg + repeat TV u/s tomorrow. - will need OB follow up.    Dispo: Disposition is deferred at this time, awaiting improvement of current medical problems.  Anticipated discharge in approximately 1-2 day(s).   The patient does have a current PCP (No primary care provider on file.) and does need an [facility] hospital follow-up appointment after discharge.  The patient does have transportation limitations that hinder transportation to clinic appointments.  .Services Needed at time of discharge: Y = Yes, Blank = No PT:   OT:   RN:   Equipment:   Other:     LOS: 2 days   John Giovanni, MD 07/02/2015, 4:00 PM

## 2015-07-02 NOTE — Care Management Note (Signed)
Case Management Note  Patient Details  Name: Linda Koch MRN: [id number] Date of Birth: [birth year]  Subjective/Objective:  Pt admitted for Nausea (+) Pregnancy. In ED received 2 IV boluses. Pt is from home.                  Action/Plan: No needs identified by CM at this time.    Expected Discharge Date:                  Expected Discharge Plan:  Home/Self Care  In-House Referral:  NA  Discharge planning Services  CM Consult  Post Acute Care Choice:  NA Choice offered to:  NA  DME Arranged:  N/A DME Agency:  NA  HH Arranged:  NA HH Agency:  NA  Status of Service:  Completed, signed off  Medicare Important Message Given:    Date Medicare IM Given:    Medicare IM give by:    Date Additional Medicare IM Given:    Additional Medicare Important Message give by:     If discussed at Long Length of Stay Meetings, dates discussed:    Additional Comments:  Gala LewandowskyGraves-Bigelow, Soleia Badolato Kaye, RN 07/02/2015, 1:23 PM

## 2015-07-02 NOTE — Progress Notes (Signed)
Patient ID: Linda PierKeyandria Koch, female   DOB: 1991/05/15, 24 y.o.   MRN: [id number] Medicine attending: [name] together with resident physician Dr. John GiovanniVasundhra Rathore and we discussed a management plan. [age] year old woman who presented on November 19 with a 2 day history of intractable nausea and vomiting. She denied any urinary tract symptoms. She had subjective chills but no fevers. No sick contacts. Afebrile at time of admission. White count 12,000 with 88% neutrophils. Most recent white count recorded 10,700 on October 18. Regular abdominal x-rays were unremarkable. She was found to have a positive pregnancy test. She is being treated symptomatically at this time with IV fluids and antiemetics. Ultrasound failed to demonstrate a intrauterine gestational sac so we need to confirm that she is in fact pregnant. HCG 266 on November 19. 318 today. We will continue to monitor. Abdominal pain has resolved and the abdomen is soft and nontender on exam today. She is still nauseated and unable to eat solid food.

## 2015-07-02 NOTE — Progress Notes (Signed)
UR Completed Jahid Weida Graves-Bigelow, RN,BSN [phone]  

## 2015-07-03 DIAGNOSIS — O219 Vomiting of pregnancy, unspecified: Secondary | ICD-10-CM

## 2015-07-03 DIAGNOSIS — I4891 Unspecified atrial fibrillation: Secondary | ICD-10-CM

## 2015-07-03 LAB — CBC
HEMATOCRIT: 35.8 % — AB (ref 36.0–46.0)
HEMOGLOBIN: 12.3 g/dL (ref 12.0–15.0)
MCH: 30.7 pg (ref 26.0–34.0)
MCHC: 34.4 g/dL (ref 30.0–36.0)
MCV: 89.3 fL (ref 78.0–100.0)
Platelets: 287 10*3/uL (ref 150–400)
RBC: 4.01 MIL/uL (ref 3.87–5.11)
RDW: 13.3 % (ref 11.5–15.5)
WBC: 11.5 10*3/uL — ABNORMAL HIGH (ref 4.0–10.5)

## 2015-07-03 LAB — BASIC METABOLIC PANEL
Anion gap: 8 (ref 5–15)
CALCIUM: 9.1 mg/dL (ref 8.9–10.3)
CO2: 23 mmol/L (ref 22–32)
Chloride: 105 mmol/L (ref 101–111)
Creatinine, Ser: 0.63 mg/dL (ref 0.44–1.00)
GFR calc Af Amer: >60 mL/min (ref >60)
GLUCOSE: 90 mg/dL (ref 65–99)
POTASSIUM: 3.3 mmol/L — AB (ref 3.5–5.1)
Sodium: 136 mmol/L (ref 135–145)

## 2015-07-03 LAB — HCG, QUANTITATIVE, PREGNANCY: hCG, Beta Chain, Quant, S: 431 m[IU]/mL — ABNORMAL HIGH (ref <5)

## 2015-07-03 MED ORDER — POTASSIUM CHLORIDE 10 MEQ/100ML IV SOLN
10.0000 meq | INTRAVENOUS | Status: AC
  Administered 2015-07-03 (×2): 10 meq via INTRAVENOUS
  Filled 2015-07-03 (×2): qty 100

## 2015-07-03 MED ORDER — POTASSIUM CHLORIDE IN NACL 20-0.9 MEQ/L-% IV SOLN
INTRAVENOUS | Status: AC
  Administered 2015-07-03: 1000 mL via INTRAVENOUS
  Administered 2015-07-03: 22:00:00 via INTRAVENOUS
  Filled 2015-07-03 (×4): qty 1000

## 2015-07-03 MED ORDER — POTASSIUM CHLORIDE CRYS ER 20 MEQ PO TBCR: 40.0000 meq | EXTENDED_RELEASE_TABLET | Freq: Once | ORAL | Status: DC

## 2015-07-03 NOTE — Progress Notes (Signed)
Pt refused to stand up to get weighed this morning due to being nauseated. Pt agreed to be weighed at a later time today.   Tera Helperooper, Karis Emig E

## 2015-07-03 NOTE — Progress Notes (Signed)
Patient ID: Linda PierKeyandria Koch, female   DOB: 08/28/1990, 24 y.o.   MRN: [id number] Medicine attending: [name] together with resident physician Dr. John GiovanniVasundhra Rathore. She is making slow progress. Still intermittent nausea. Starting to take clear liquids. Still not able to take solid foods. She remains in sinus rhythm. Pulse rate 68-76 currently off all antiarrhythmics. Plan to advance diet today as tolerated and anticipate discharge tomorrow.

## 2015-07-03 NOTE — Progress Notes (Signed)
Subjective:  Patient has intermittent vomiting and not able to tolerate solid foods. Has some epigastric and LUQ abdominal pain/ burning associated with the vomitting. No cp, sob, fever/chills. States Zofran makes her "sick" and Phenergan is helping.    Objective: Vital signs in last 24 hours: Filed Vitals:   07/02/15 1500 07/02/15 2137 07/03/15 0608 07/03/15 1459  BP:  123/89 137/89 123/85  Pulse:  68 76 91  Temp: 98.9 F (37.2 C) 98.1 F (36.7 C) 98.8 F (37.1 C) 99 F (37.2 C)  TempSrc: Oral Oral Oral Oral  Resp: Height:      Weight:      SpO2: 100% 98% 98% 100%   Weight change:   Intake/Output Summary (Last 24 hours) at 07/03/15 1649 Last data filed at 07/03/15 1026  Gross per 24 hour  Intake    200 ml  Output      0 ml  Net    200 ml   Vitals reviewed. Cardiac: RRR, no rubs, murmurs or gallops Pulm: clear to auscultation bilaterally, no wheezes, rales, or rhonchi Abd: Soft, nondistended, +BS. Abdomen mildly tender to palpation in the epigastric and LUQ regions. No rebound, guarding, or rigidity.  Ext: warm and well perfused, no pedal edema Neuro: alert and oriented X3,  Lab Results: Basic Metabolic Panel:  Recent Labs Lab 07/02/15 0434 07/03/15 0451  NA 137 136  K 3.3* 3.3*  CL 103 105  CO2 24 23  GLUCOSE 82 90  BUN <5* <5*  CREATININE 0.59 0.63  CALCIUM 9.3 9.1  MG 1.8  --    Liver Function Tests:  Recent Labs Lab 06/30/15 0600 07/01/15 0331  AST 20 18  ALT 18 16  ALKPHOS 44 37*  BILITOT 1.2 1.3*  PROT 8.1 6.9  ALBUMIN 4.4 3.6   CBC:  Recent Labs Lab 06/30/15 0600 07/01/15 0331 07/02/15 0434 07/03/15 0451  WBC 12.3* 13.5* 11.9* 11.5*  NEUTROABS 10.9* 10.3*  --   --   HGB 12.5 11.9* 12.5 12.3  HCT 36.5 35.3* 36.0 35.8*  MCV 90.3 90.3 89.6 89.3  PLT 273 286 318 287   Thyroid Function Tests:  Recent Labs Lab 06/30/15 1400  TSH 0.550   Urine Drug Screen: Drugs of Abuse     Component Value Date/Time   LABOPIA NONE DETECTED 06/30/2015 2340   COCAINSCRNUR NONE DETECTED 06/30/2015 2340   LABBENZ NONE DETECTED 06/30/2015 2340   AMPHETMU NONE DETECTED 06/30/2015 2340   THCU POSITIVE* 06/30/2015 2340   LABBARB NONE DETECTED 06/30/2015 2340    Urinalysis:  Recent Labs Lab 06/30/15 0600  COLORURINE YELLOW  LABSPEC 1.036*  PHURINE 5.5  GLUCOSEU NEGATIVE  HGBUR NEGATIVE  BILIRUBINUR NEGATIVE  KETONESUR >80*  PROTEINUR 30*  NITRITE NEGATIVE  LEUKOCYTESUR NEGATIVE   Misc. Labs:  Micro Results: No results found for this or any previous visit (from the past 240 hour(s)). Studies/Results: No results found. Medications: I have reviewed the patient's current medications. Scheduled Meds: . aspirin EC  81 mg Oral Daily  . enoxaparin (LOVENOX) injection  40 mg Subcutaneous Q24H  . pantoprazole  40 mg Oral Daily   Continuous Infusions: . 0.9 % NaCl with KCl 20 mEq / L     PRN Meds:.acetaminophen **OR** acetaminophen, metoprolol, promethazine Assessment/Plan: Principal Problem:   Atrial fibrillation with RVR (HCC) Active Problems:   Irregular cardiac rhythm   Nausea and vomiting during pregnancy   Mild tetrahydrocannabinol (THC) abuse   Emesis  [name]  last LMP 1 month ago, UPT+, BCG in 200's, no confirmed intrauterine pregnancy on u/s here with intractible n/v and also new onset AFib RVR.  New onset Afib RVR - likely incited intractable vomitting. ECHO was done and it was normal. HR was in 130-140's on admission and she received IV metoprolol yesterday. Currently in normal sinus rhythm (HR 72-85).  CHADSVASC2 score of 1 (for female gender).  - hold metop for now. Has PRN metop for sustained HR >120. - started asa 3581 - will need f/up with high risk pregnancy practice.  Intractable vomitting during pregnancy - likely 2/2 to hyperemesis gravidarum vs cannabinoid hyperemesis syndrome. She does endorse GERD symptoms. Patient continues to have intermittent nausea and is not  able to tolerate solid foods.  - D/c zofran  - cont phenergan PRN - Protonix 40 mg daily  - Advance diet as tolerated - cont IVF and replete K  - warm shower when able to tolerate - asked to quit THC use.  Current pregnancy - UPT+, beta hcg 266 (would correlate with 2-[redacted] week gestation), LMP 1 month ago.  Trans vaginal u/s could not confirm intrauterine pregnancy. Low likelyhood of Ectopic - no significant abdominal pain or mass identified on u/s. Repeat beta HCG after 72 hrs is 431.  - will need OB follow up.    Dispo: Disposition is deferred at this time, awaiting improvement of current medical problems.  Anticipated discharge in approximately 1-2 day(s).   The patient does have a current PCP (No primary care provider on file.) and [facility] discharge.  The patient does have transportation limitations that hinder transportation to clinic appointments.  .Services Needed at time of discharge: Y = Yes, Blank = No PT:   OT:   RN:   Equipment:   Other:     LOS: 3 days   Linda GiovanniVasundhra Elsy Chiang, MD 07/03/2015, 4:49 PM

## 2015-07-04 ENCOUNTER — Encounter (HOSPITAL_COMMUNITY): Payer: Self-pay | Admitting: Internal Medicine

## 2015-07-04 DIAGNOSIS — Z331 Pregnant state, incidental: Secondary | ICD-10-CM

## 2015-07-04 DIAGNOSIS — Z3490 Encounter for supervision of normal pregnancy, unspecified, unspecified trimester: Secondary | ICD-10-CM | POA: Insufficient documentation

## 2015-07-04 DIAGNOSIS — F1210 Cannabis abuse, uncomplicated: Secondary | ICD-10-CM

## 2015-07-04 LAB — BASIC METABOLIC PANEL
ANION GAP: 9 (ref 5–15)
BUN: <5 mg/dL — ABNORMAL LOW (ref 6–20)
CALCIUM: 9.1 mg/dL (ref 8.9–10.3)
CHLORIDE: 105 mmol/L (ref 101–111)
CO2: 24 mmol/L (ref 22–32)
CREATININE: 0.66 mg/dL (ref 0.44–1.00)
GFR calc non Af Amer: >60 mL/min (ref >60)
Glucose, Bld: 91 mg/dL (ref 65–99)
Potassium: 3.4 mmol/L — ABNORMAL LOW (ref 3.5–5.1)
SODIUM: 138 mmol/L (ref 135–145)

## 2015-07-04 LAB — CBC
HCT: 36.3 % (ref 36.0–46.0)
HEMOGLOBIN: 12.4 g/dL (ref 12.0–15.0)
MCH: 30.5 pg (ref 26.0–34.0)
MCHC: 34.2 g/dL (ref 30.0–36.0)
MCV: 89.2 fL (ref 78.0–100.0)
Platelets: 297 10*3/uL (ref 150–400)
RBC: 4.07 MIL/uL (ref 3.87–5.11)
RDW: 13.3 % (ref 11.5–15.5)
WBC: 9.5 10*3/uL (ref 4.0–10.5)

## 2015-07-04 MED ORDER — PROMETHAZINE HCL 12.5 MG PO TABS
12.5000 mg | ORAL_TABLET | Freq: Four times a day (QID) | ORAL | Status: DC | PRN
Start: 2015-07-04 — End: 2015-11-18

## 2015-07-04 MED ORDER — GUAIFENESIN ER 600 MG PO TB12
600.0000 mg | ORAL_TABLET | Freq: Two times a day (BID) | ORAL | Status: DC
  Administered 2015-07-04: 600 mg via ORAL
  Filled 2015-07-04: qty 1

## 2015-07-04 MED ORDER — POTASSIUM CHLORIDE IN NACL 40-0.9 MEQ/L-% IV SOLN
INTRAVENOUS | Status: DC
  Administered 2015-07-04: 100 mL/h via INTRAVENOUS
  Filled 2015-07-04 (×2): qty 1000

## 2015-07-04 MED ORDER — ASPIRIN 81 MG PO TBEC: 81.0000 mg | DELAYED_RELEASE_TABLET | Freq: Every day | ORAL | Status: DC

## 2015-07-04 NOTE — Plan of Care (Signed)
Problem: Physical Regulation: Goal: Ability to maintain clinical measurements within normal limits will improve Outcome: Progressing Patient's WBC's still elevated but patient remains free from elevated temperature at this time, will continue to monitor

## 2015-07-04 NOTE — Progress Notes (Signed)
Discharge teaching and instructions reviewed. Pt has no questions. Belongings gathered. VSS. Pt discharging home with boyfriend.

## 2015-07-04 NOTE — Discharge Summary (Signed)
Name: Linda Koch MRN: [id number] DOB: 1991-01-24 24 y.o. PCP: No primary care provider on file.  Date of Admission: 06/30/2015  5:28 AM Date of Discharge: 07/06/2015 Attending Physician: No att. providers found  Discharge Diagnosis:  Principal Problem:   Atrial fibrillation with RVR (HCC) Active Problems:   Irregular cardiac rhythm   Nausea and vomiting during pregnancy   Mild tetrahydrocannabinol (THC) abuse   Emesis   Pregnancy  Discharge Medications:   Medication List    STOP taking these medications        ondansetron 4 MG disintegrating tablet  Commonly known as:  ZOFRAN ODT      TAKE these medications        aspirin 81 MG EC tablet  Take 1 tablet (81 mg total) by mouth daily.     pantoprazole 20 MG tablet  Commonly known as:  PROTONIX  Take 1 tablet (20 mg total) by mouth daily.     promethazine 12.5 MG tablet  Commonly known as:  PHENERGAN  Take 1 tablet (12.5 mg total) by mouth every 6 (six) hours as needed for nausea or vomiting.        Disposition and follow-up:   [name] was discharged from [facility] in Stable condition.  At the hospital follow up visit please address:  1. Pregnancy and new onset A-fib diagnosis - patient will need close OB follow-up  2.  Labs / imaging needed at time of follow-up: repeat transvaginal ultrasound   3.  Pending labs/ test needing follow-up:   Follow-up Appointments:     Follow-up Information    Follow up with CALLAHAN, SIDNEY, DO. Go on 08/03/2015.   Specialty:  Obstetrics and Gynecology   Why:  Follow up appointment at 3pm.    Contact information:   [address] [place] [address] [phone]       Follow up with Verlon Au, MD. Go on 07/13/2015.   Specialty:  Family Medicine   Why:  Follow up appointment at 9:10 am.    Contact information:   [address] [facility] [place] [address] [phone]      Procedures Performed:  US Ob Comp Less 14 Wks  06/30/2015  CLINICAL DATA:  24 year old with intractable nausea and vomiting. Positive urine pregnancy test. EXAM: OBSTETRIC <14 WK [place] AND TRANSVAGINAL OB US TECHNIQUE: Both transabdominal and transvaginal ultrasound examinations were performed for complete evaluation of the gestation as well as the maternal uterus, adnexal regions, and pelvic cul-de-sac. Transvaginal technique was performed to assess early pregnancy. COMPARISON:  10/12/2014 FINDINGS: Intrauterine gestational sac: None Maternal uterus/adnexae: Uterus is in a retroverted position. There is no evidence for an intrauterine gestational sac. Normal appearance of the endometrial stripe roughly measuring 3 mm. Normal appearance of the right ovary measuring 3.2 x 1.5 x 1.5 cm. Normal appearance of the left ovary measuring 3.4 x 1.9 x 2.8 cm. There is a round heterogeneous structure within the left ovary measuring up to 2.1 cm. Trace free fluid. IMPRESSION: No evidence for an intrauterine pregnancy. Cannot exclude an early pregnancy and recommend follow-up quantitative beta HCG levels. Heterogeneous structure in the left ovary is nonspecific but could represent a corpus luteum. Electronically Signed   By: Richarda Overlie M.D.   On: 06/30/2015 10:56   US Ob Transvaginal  06/30/2015  CLINICAL DATA:  24 year old with intractable nausea and vomiting. Positive urine pregnancy test. EXAM: OBSTETRIC <14 WK [place] AND TRANSVAGINAL OB US  TECHNIQUE: Both transabdominal and transvaginal ultrasound examinations were performed for complete evaluation of the gestation as well as the maternal uterus, adnexal regions, and pelvic cul-de-sac. Transvaginal technique was performed to assess early pregnancy. COMPARISON:  10/12/2014 FINDINGS: Intrauterine gestational sac: None Maternal uterus/adnexae: Uterus is in a retroverted position. There is no evidence for an intrauterine gestational sac. Normal appearance of the endometrial  stripe roughly measuring 3 mm. Normal appearance of the right ovary measuring 3.2 x 1.5 x 1.5 cm. Normal appearance of the left ovary measuring 3.4 x 1.9 x 2.8 cm. There is a round heterogeneous structure within the left ovary measuring up to 2.1 cm. Trace free fluid. IMPRESSION: No evidence for an intrauterine pregnancy. Cannot exclude an early pregnancy and recommend follow-up quantitative beta HCG levels. Heterogeneous structure in the left ovary is nonspecific but could represent a corpus luteum. Electronically Signed   By: Richarda Overlie M.D.   On: 06/30/2015 10:56    Admission HPI: Patient is a 24 year old [id number] with no significant past medical history presenting to [facility] with a history of nausea and vomiting for the past 2 days. Patient states she has not been able to keep anything down but still continues to make urine. Patient was vomiting when we were in the room. Also reports feeling dizzy, coughing, and having epigastric abdominal pain. Denies any loss of consciousness. Denies trying any new foods or recent sick contacts. Denies having any diarrhea. Reports having chills but no fever. States she had GERD during her previous pregnancy. Denies any cardiac history during pregnancy or otherwise. Denies any history of thyroid disorders. Denies taking any medications. Reports using marijuana 1-2 times per week, most recent use was yesterday.  In the ED, patient's beta hCG was found to be positive. Patient states she had her period last month and was due for her period around this time of the month but did not have it. States she is sexually active with one partner and is in a monogamous relationship. As per patient, she had a chlamydia infection in high school. Denies having any recent STI's. Denies noticing any vaginal discharge or vaginal pruritus. Denies having any dysuria, frequency, or urgency. Patient states she has been pregnant 4 times in the past - delivered a girl in August 2015 and  had miscarriages in 2014 and March 2016. Also reports having an abortion at the age of 89. Denies having any pregnancy related complications in the past.  Her PCP is Dr. Virl Son at [facility] Course by problem list: Principal Problem:   Atrial fibrillation with RVR (HCC) Active Problems:   Irregular cardiac rhythm   Nausea and vomiting during pregnancy   Mild tetrahydrocannabinol (THC) abuse   Emesis   Pregnancy   New onset Afib Patient is a young lady with no significant past medical history presenting with new onset A. Fib. EKG showing atrial fibrillation and PVCs. Thyroid function normal. UDS positive for cannabinoids but no other drugs. Patient was also having intractable nausea and vomiting for the past 2 days which could have explained her tachycardia (HR upto 150s on admission). She went back into sinus rhythm after receiving 2 doses of IV Metoprolol 5 mg. ECHO showing LVEF 55-60%. CHADSVASC2 score of 1 (for female gender). Patient was started on asa 81 mg daily.   Intractable vomiting during pregnancy  She presented with a 2 day history of intractable nausea and vomiting. Serial hCG levels confirmed pregnancy although an initial transvaginal ultrasound on  admission failed to reveal a amniotic sac. Her symptoms were likely due to hyperemesis gravidarum vs cannabinoid hyperemesis syndrome (UDS positive for cannabinoids). In addition, patient endorsed GERD symptoms. She received IVF, phenergan, zofran, and protonix. Patient continued to have intermittent nausea, however, tolerated solid food intake better on the day of discharge and felt comfortable going home.     Discharge Vitals:   BP 140/96 mmHg  Pulse 82  Temp(Src) 98.9 F (37.2 C) (Oral)  Resp 18  Ht 5\' 3"  (1.6 m)  Wt 134 lb 4.8 oz (60.918 kg)  BMI 23.80 kg/m2  SpO2 100%  LMP 05/28/2015  Discharge Labs:  No results found for this or any previous visit (from the past 24  hour(s)).  Signed: John GiovanniVasundhra Clavin Ruhlman, MD 07/06/2015, 9:01 AM    Services Ordered on Discharge:  Equipment Ordered on Discharge:

## 2015-07-04 NOTE — Progress Notes (Signed)
Subjective:  Patient still continues to have intermittent vomiting. However, oral intake of solid foods has improved. Denies having any abdominal pain.  No cp, sob, fever/chills.   Objective: Vital signs in last 24 hours: Filed Vitals:   07/03/15 1459 07/03/15 2055 07/04/15 0400 07/04/15 0500  BP: 123/85 119/69  135/81  Pulse: 91 74  95  Temp: 99 F (37.2 C) 99.2 F (37.3 C)  98.2 F (36.8 C)  TempSrc: Oral Oral  Oral  Resp:  18  18  Height:      Weight:   134 lb 4.8 oz (60.918 kg)   SpO2: 100% 99%  100%   Weight change:   Intake/Output Summary (Last 24 hours) at 07/04/15 1258 Last data filed at 07/04/15 1008  Gross per 24 hour  Intake 891.67 ml  Output   1200 ml  Net -308.33 ml   Vitals reviewed. Cardiac: RRR, no rubs, murmurs or gallops Pulm: clear to auscultation bilaterally, no wheezes, rales, or rhonchi Abd: Soft, non-tender, nondistended, +BS. No rebound, guarding, or rigidity.  Ext: warm and well perfused, no pedal edema Neuro: alert and oriented X3,  Lab Results: Basic Metabolic Panel:  Recent Labs Lab 07/02/15 0434 07/03/15 0451 07/04/15 0310  NA 137 136 138  K 3.3* 3.3* 3.4*  CL 103 105 105  CO2 24 23 24   GLUCOSE 82 90 91  BUN <5* <5* <5*  CREATININE 0.59 0.63 0.66  CALCIUM 9.3 9.1 9.1  MG 1.8  --   --    Liver Function Tests:  Recent Labs Lab 06/30/15 0600 07/01/15 0331  AST 20 18  ALT 18 16  ALKPHOS 44 37*  BILITOT 1.2 1.3*  PROT 8.1 6.9  ALBUMIN 4.4 3.6   CBC:  Recent Labs Lab 06/30/15 0600 07/01/15 0331  07/03/15 0451 07/04/15 0310  WBC 12.3* 13.5*  < > 11.5* 9.5  NEUTROABS 10.9* 10.3*  --   --   --   HGB 12.5 11.9*  < > 12.3 12.4  HCT 36.5 35.3*  < > 35.8* 36.3  MCV 90.3 90.3  < > 89.3 89.2  PLT 273 286  < > 287 297  < > = values in this interval not displayed. Thyroid Function Tests:  Recent Labs Lab 06/30/15 1400  TSH 0.550   Urine Drug Screen: Drugs of Abuse     Component Value Date/Time   LABOPIA NONE  DETECTED 06/30/2015 2340   COCAINSCRNUR NONE DETECTED 06/30/2015 2340   LABBENZ NONE DETECTED 06/30/2015 2340   AMPHETMU NONE DETECTED 06/30/2015 2340   THCU POSITIVE* 06/30/2015 2340   LABBARB NONE DETECTED 06/30/2015 2340    Urinalysis:  Recent Labs Lab 06/30/15 0600  COLORURINE YELLOW  LABSPEC 1.036*  PHURINE 5.5  GLUCOSEU NEGATIVE  HGBUR NEGATIVE  BILIRUBINUR NEGATIVE  KETONESUR >80*  PROTEINUR 30*  NITRITE NEGATIVE  LEUKOCYTESUR NEGATIVE   Micro Results: No results found for this or any previous visit (from the past 240 hour(s)). Studies/Results: No results found. Medications: I have reviewed the patient's current medications. Scheduled Meds: . aspirin EC  81 mg Oral Daily  . enoxaparin (LOVENOX) injection  40 mg Subcutaneous Q24H  . guaiFENesin  600 mg Oral BID  . pantoprazole  40 mg Oral Daily   Continuous Infusions: . 0.9 % NaCl with KCl 40 mEq / L 100 mL/hr (07/04/15 0853)   PRN Meds:.acetaminophen **OR** acetaminophen, metoprolol, promethazine Assessment/Plan: Principal Problem:   Atrial fibrillation with RVR (HCC) Active Problems:   Irregular cardiac rhythm  Nausea and vomiting during pregnancy   Mild tetrahydrocannabinol (THC) abuse   Emesis  24 yo G5T1P0A3L1 last LMP 1 month ago, UPT+, BCG in 200's, no confirmed intrauterine pregnancy on u/s here with intractible n/v and also new onset AFib RVR.  New onset Afib RVR - likely incited intractable vomitting. ECHO was done and it was normal. HR was in 130-140's on admission and she received IV metoprolol yesterday. CHADSVASC2 score of 1 (for female gender). Currently in normal sinus rhythm.  - hold metop for now. Has PRN metop for sustained HR >120. - started asa 59 - will need f/up with high risk pregnancy practice.  Intractable vomitting during pregnancy - likely 2/2 to hyperemesis gravidarum vs cannabinoid hyperemesis syndrome. She does endorse GERD symptoms. Patient continues to have intermittent  nausea. If she is able to tolerate eating her meals today, likely discharge.   - cont phenergan PRN - Protonix 40 mg daily  - Advance diet as tolerated - cont IVF and replete K  - warm shower when able to tolerate - asked to quit THC use.  Current pregnancy - UPT+, beta hcg 266 (would correlate with 2-[redacted] week gestation), LMP 1 month ago.  Trans vaginal u/s could not confirm intrauterine pregnancy. Low likelyhood of Ectopic - no significant abdominal pain or mass identified on u/s. Repeat beta HCG after 72 hrs is 431.  - will need OB follow up.    Dispo: Disposition is deferred at this time, awaiting improvement of current medical problems.  Anticipated discharge in approximately 0-1 day(s).   The patient does have a current PCP (No primary care provider on file.) and does need an [facility] hospital follow-up appointment after discharge.  The patient does have transportation limitations that hinder transportation to clinic appointments.  .Services Needed at time of discharge: Y = Yes, Blank = No PT:   OT:   RN:   Equipment:   Other:     LOS: 4 days   John Giovanni, MD 07/04/2015, 12:58 PM

## 2015-07-04 NOTE — Progress Notes (Signed)
07/04/15 0100 Report received via Alfredo Bachecil RN using SBAR format, reviewed chart, orders, labs, VS, meds and patient's general condition, assumed care of patient

## 2015-07-04 NOTE — Plan of Care (Signed)
Problem: Nutrition: Goal: Adequate nutrition will be maintained Outcome: Progressing Patient has an IV of 1/2 NS with 20 MEQS of KCL running at 100cc/hr and is drinking water and gingerale but still c/o nausea and will request her pheneran as needed

## 2015-11-18 ENCOUNTER — Emergency Department (HOSPITAL_COMMUNITY)
Admission: EM | Admit: 2015-11-18 | Discharge: 2015-11-18 | Disposition: A | Discharge status: Home / Self Care | Payer: Medicaid Other | Attending: Emergency Medicine | Admitting: Emergency Medicine

## 2015-11-18 ENCOUNTER — Encounter (HOSPITAL_COMMUNITY): Payer: Self-pay | Admitting: *Deleted

## 2015-11-18 DIAGNOSIS — Z8619 Personal history of other infectious and parasitic diseases: Secondary | ICD-10-CM | POA: Diagnosis not present

## 2015-11-18 DIAGNOSIS — Z79899 Other long term (current) drug therapy: Secondary | ICD-10-CM | POA: Diagnosis not present

## 2015-11-18 DIAGNOSIS — Z87891 Personal history of nicotine dependence: Secondary | ICD-10-CM | POA: Insufficient documentation

## 2015-11-18 DIAGNOSIS — F1210 Cannabis abuse, uncomplicated: Secondary | ICD-10-CM | POA: Diagnosis not present

## 2015-11-18 DIAGNOSIS — Z3202 Encounter for pregnancy test, result negative: Secondary | ICD-10-CM | POA: Diagnosis not present

## 2015-11-18 DIAGNOSIS — Z7982 Long term (current) use of aspirin: Secondary | ICD-10-CM | POA: Diagnosis not present

## 2015-11-18 DIAGNOSIS — R1010 Upper abdominal pain, unspecified: Secondary | ICD-10-CM | POA: Diagnosis not present

## 2015-11-18 DIAGNOSIS — R1013 Epigastric pain: Secondary | ICD-10-CM | POA: Insufficient documentation

## 2015-11-18 DIAGNOSIS — F10920 Alcohol use, unspecified with intoxication, uncomplicated: Secondary | ICD-10-CM

## 2015-11-18 DIAGNOSIS — F10120 Alcohol abuse with intoxication, uncomplicated: Secondary | ICD-10-CM | POA: Insufficient documentation

## 2015-11-18 LAB — RAPID URINE DRUG SCREEN, HOSP PERFORMED
AMPHETAMINES: NOT DETECTED
BARBITURATES: NOT DETECTED
BENZODIAZEPINES: NOT DETECTED
COCAINE: NOT DETECTED
Opiates: NOT DETECTED
Tetrahydrocannabinol: POSITIVE — AB

## 2015-11-18 LAB — CBC WITH DIFFERENTIAL/PLATELET
BASOS ABS: 0 10*3/uL (ref 0.0–0.1)
Basophils Relative: 1 %
EOS ABS: 0.1 10*3/uL (ref 0.0–0.7)
EOS PCT: 2 %
HCT: 38.7 % (ref 36.0–46.0)
HEMOGLOBIN: 13.1 g/dL (ref 12.0–15.0)
LYMPHS PCT: 37 %
Lymphs Abs: 2 10*3/uL (ref 0.7–4.0)
MCH: 31.9 pg (ref 26.0–34.0)
MCHC: 33.9 g/dL (ref 30.0–36.0)
MCV: 94.2 fL (ref 78.0–100.0)
MONO ABS: 0.2 10*3/uL (ref 0.1–1.0)
Monocytes Relative: 4 %
Neutro Abs: 3.1 10*3/uL (ref 1.7–7.7)
Neutrophils Relative %: 56 %
PLATELETS: 296 10*3/uL (ref 150–400)
RBC: 4.11 MIL/uL (ref 3.87–5.11)
RDW: 13.7 % (ref 11.5–15.5)
WBC: 5.5 10*3/uL (ref 4.0–10.5)

## 2015-11-18 LAB — COMPREHENSIVE METABOLIC PANEL
ALBUMIN: 4.1 g/dL (ref 3.5–5.0)
ALK PHOS: 45 U/L (ref 38–126)
ALT: 13 U/L — AB (ref 14–54)
AST: 18 U/L (ref 15–41)
Anion gap: 13 (ref 5–15)
BUN: <5 mg/dL — ABNORMAL LOW (ref 6–20)
CALCIUM: 9.2 mg/dL (ref 8.9–10.3)
CHLORIDE: 106 mmol/L (ref 101–111)
CO2: 23 mmol/L (ref 22–32)
CREATININE: 0.61 mg/dL (ref 0.44–1.00)
GFR calc Af Amer: >60 mL/min (ref >60)
GFR calc non Af Amer: >60 mL/min (ref >60)
GLUCOSE: 111 mg/dL — AB (ref 65–99)
Potassium: 3.4 mmol/L — ABNORMAL LOW (ref 3.5–5.1)
SODIUM: 142 mmol/L (ref 135–145)
Total Bilirubin: 1 mg/dL (ref 0.3–1.2)
Total Protein: 7.2 g/dL (ref 6.5–8.1)

## 2015-11-18 LAB — I-STAT BETA HCG BLOOD, ED (MC, WL, AP ONLY): I-stat hCG, quantitative: <5 m[IU]/mL (ref <5)

## 2015-11-18 LAB — LIPASE, BLOOD: LIPASE: 22 U/L (ref 11–51)

## 2015-11-18 LAB — ETHANOL: Alcohol, Ethyl (B): 126 mg/dL — ABNORMAL HIGH (ref <5)

## 2015-11-18 MED ORDER — ONDANSETRON HCL 4 MG/2ML IJ SOLN: 4.0000 mg | Freq: Once | INTRAMUSCULAR | Status: DC

## 2015-11-18 MED ORDER — PROMETHAZINE HCL 12.5 MG PO TABS: 12.5000 mg | ORAL_TABLET | Freq: Four times a day (QID) | ORAL | Status: DC | PRN

## 2015-11-18 MED ORDER — FENTANYL CITRATE (PF) 100 MCG/2ML IJ SOLN: 25.0000 ug | Freq: Once | INTRAMUSCULAR | Status: DC

## 2015-11-18 MED ORDER — ESOMEPRAZOLE MAGNESIUM 40 MG PO CPDR: 40.0000 mg | DELAYED_RELEASE_CAPSULE | Freq: Every day | ORAL | Status: DC

## 2015-11-18 MED ORDER — SODIUM CHLORIDE 0.9 % IV BOLUS (SEPSIS)
1000.0000 mL | Freq: Once | INTRAVENOUS | Status: AC
  Administered 2015-11-18: 1000 mL via INTRAVENOUS

## 2015-11-18 MED ORDER — FAMOTIDINE 20 MG PO TABS: 20.0000 mg | ORAL_TABLET | Freq: Two times a day (BID) | ORAL | Status: DC | PRN

## 2015-11-18 MED ORDER — HALOPERIDOL LACTATE 5 MG/ML IJ SOLN
2.0000 mg | Freq: Once | INTRAMUSCULAR | Status: AC
  Administered 2015-11-18: 2 mg via INTRAVENOUS
  Filled 2015-11-18: qty 1

## 2015-11-18 MED ORDER — ONDANSETRON HCL 4 MG/2ML IJ SOLN
4.0000 mg | Freq: Once | INTRAMUSCULAR | Status: AC
  Administered 2015-11-18: 4 mg via INTRAVENOUS
  Filled 2015-11-18: qty 2

## 2015-11-18 NOTE — ED Provider Notes (Signed)
CSN: [id number]     Arrival date & time 11/18/15  [id number] History   First MD Initiated Contact with Patient 11/18/15 [phone]     Chief Complaint  Patient presents with  . Alcohol Intoxication     (Consider location/radiation/quality/duration/timing/severity/associated sxs/prior Treatment) The history is provided by the patient.  Linda Koch is a 25 y.o. female history of Chlamydia here presenting with vomiting, alcohol intoxication. States that she was dancing at a party and was drinking alcohol last night. She states that she has several episodes of bloody vomit this morning. She denies any other drug use. States has some epigastric pain as well. Denies drinking alcohol daily but just occasionally.   Level V caveat- alcohol intoxication   Past Medical History  Diagnosis Date  . Chlamydia    Past Surgical History  Procedure Laterality Date  . No past surgeries    . Cesarean section N/A 03/29/2014    Procedure: CESAREAN SECTION;  Surgeon: Loney LaurenceMichelle A Horvath, MD;  Location: [facility];  Service: Obstetrics;  Laterality: N/A;   Family History  Problem Relation Age of Onset  . Heart disease Maternal Grandfather    Social History  Substance Use Topics  . Smoking status: Former Games developermoker  . Smokeless tobacco: Never Used     Comment: quit iearly 2015  . Alcohol Use: Yes     Comment: rare, last was Christmas   OB History    Gravida Para Term Preterm AB TAB SAB Ectopic Multiple Living   5 1  1 2 1 1   1      Review of Systems  Gastrointestinal: Positive for abdominal pain.  All other systems reviewed and are negative.     Allergies  Review of patient's allergies indicates no known allergies.  Home Medications   Prior to Admission medications   Medication Sig Start Date End Date Taking? Authorizing Provider  aspirin EC 81 MG EC tablet Take 1 tablet (81 mg total) by mouth daily. 07/04/15   John GiovanniVasundhra Rathore, MD  pantoprazole (PROTONIX) 20 MG tablet Take 1 tablet (20 mg total) by  mouth daily. 05/30/15   Loren Raceravid Yelverton, MD  promethazine (PHENERGAN) 12.5 MG tablet Take 1 tablet (12.5 mg total) by mouth every 6 (six) hours as needed for nausea or vomiting. 07/04/15   John GiovanniVasundhra Rathore, MD   BP 110/80 mmHg  Pulse 89  Temp(Src) 97.5 F (36.4 C) (Oral)  Resp 19  Ht 5\' 3"  (1.6 m)  Wt 142 lb (64.411 kg)  BMI 25.16 kg/m2  SpO2 100%  LMP 05/28/2015 Physical Exam  Constitutional:  Uncomfortable, intoxicated. Moaning   HENT:  Head: Normocephalic.  MM dry   Eyes: Conjunctivae are normal. Pupils are equal, round, and reactive to light.  Neck: Normal range of motion. Neck supple.  Cardiovascular: Normal rate, regular rhythm and normal heart sounds.   Pulmonary/Chest: Effort normal and breath sounds normal. No respiratory distress. She has no wheezes.  Abdominal: Soft. Bowel sounds are normal.  + epigastric tenderness, no rebound   Musculoskeletal: Normal range of motion. She exhibits no edema or tenderness.  Neurological:  Intoxicated, moving all extremities   Skin: Skin is warm and dry.  Psychiatric:  Unable   Nursing note and vitals reviewed.   ED Course  Procedures (including critical care time) Labs Review Labs Reviewed  COMPREHENSIVE METABOLIC PANEL - Abnormal; Notable for the following:    Potassium 3.4 (*)    Glucose, Bld 111 (*)    BUN <5 (*)    ALT  13 (*)    All other components within normal limits  ETHANOL - Abnormal; Notable for the following:    Alcohol, Ethyl (B) 126 (*)    All other components within normal limits  URINE RAPID DRUG SCREEN, HOSP PERFORMED - Abnormal; Notable for the following:    Tetrahydrocannabinol POSITIVE (*)    All other components within normal limits  CBC WITH DIFFERENTIAL/PLATELET  LIPASE, BLOOD  I-STAT BETA HCG BLOOD, ED ([facility], [facility], [facility] ONLY)    Imaging Review No results found. I have personally reviewed and evaluated these images and lab results as part of my medical decision-making.   EKG  Interpretation None      MDM   Final diagnoses:  None   [name] is a 25 y.o. female here with abdominal pain, hematemesis. Consider mallory weiss tear vs alcohol gastritis vs pancreatitis. Will get labs, lipase. Will hydrate and give zofran and reassess.   8:49 AM Patient ambulated in the ED with steady gait. ETOH 126, UDS + marijuana. Has some dry heaving, no vomiting in the ED. Hg 13. I think likely alcohol gastritis. Vitals stable. Will dc home with pepcid, nexium. Recommend stop drinking alcohol    Richardean Canal, MD 11/18/15 [phone]

## 2015-11-18 NOTE — ED Notes (Signed)
Pt is in stable condition upon d/c and is escorted from ED via wheelchair. 

## 2015-11-18 NOTE — ED Notes (Signed)
Unable to complete triage.  Patient came stumbling up to the front desk smelling like ETOH

## 2015-11-18 NOTE — ED Notes (Addendum)
Departure condition and discharge done on wrong patient by this RN

## 2015-11-18 NOTE — Discharge Instructions (Signed)
Stop drinking alcohol.   Stay hydrated.   Take pepcid twice daily as needed.   Take nexium daily.   See your doctor.   Return to ER if you have severe abdominal pain, vomiting, dehydration, blood in vomit.

## 2015-11-21 ENCOUNTER — Encounter (HOSPITAL_COMMUNITY): Payer: Self-pay | Admitting: Neurology

## 2015-11-21 ENCOUNTER — Inpatient Hospital Stay (HOSPITAL_COMMUNITY)
Admission: EM | Admit: 2015-11-21 | Discharge: 2015-11-25 | DRG: 392 | Disposition: A | Discharge status: Home / Self Care | Payer: Medicaid Other | Attending: Family Medicine | Admitting: Family Medicine

## 2015-11-21 ENCOUNTER — Emergency Department (HOSPITAL_COMMUNITY): Payer: Medicaid Other

## 2015-11-21 DIAGNOSIS — E860 Dehydration: Secondary | ICD-10-CM

## 2015-11-21 DIAGNOSIS — F4320 Adjustment disorder, unspecified: Secondary | ICD-10-CM | POA: Diagnosis present

## 2015-11-21 DIAGNOSIS — I499 Cardiac arrhythmia, unspecified: Secondary | ICD-10-CM

## 2015-11-21 DIAGNOSIS — Z79899 Other long term (current) drug therapy: Secondary | ICD-10-CM

## 2015-11-21 DIAGNOSIS — R1110 Vomiting, unspecified: Secondary | ICD-10-CM

## 2015-11-21 DIAGNOSIS — Z7982 Long term (current) use of aspirin: Secondary | ICD-10-CM

## 2015-11-21 DIAGNOSIS — I4891 Unspecified atrial fibrillation: Secondary | ICD-10-CM

## 2015-11-21 DIAGNOSIS — E876 Hypokalemia: Secondary | ICD-10-CM | POA: Diagnosis present

## 2015-11-21 DIAGNOSIS — F1210 Cannabis abuse, uncomplicated: Secondary | ICD-10-CM | POA: Diagnosis present

## 2015-11-21 DIAGNOSIS — R112 Nausea with vomiting, unspecified: Principal | ICD-10-CM | POA: Diagnosis present

## 2015-11-21 DIAGNOSIS — E872 Acidosis: Secondary | ICD-10-CM | POA: Diagnosis present

## 2015-11-21 LAB — I-STAT BETA HCG BLOOD, ED (MC, WL, AP ONLY): I-stat hCG, quantitative: <5 m[IU]/mL (ref <5)

## 2015-11-21 LAB — CBC
HEMATOCRIT: 44.3 % (ref 36.0–46.0)
HEMOGLOBIN: 14.9 g/dL (ref 12.0–15.0)
MCH: 31.4 pg (ref 26.0–34.0)
MCHC: 33.6 g/dL (ref 30.0–36.0)
MCV: 93.3 fL (ref 78.0–100.0)
Platelets: 270 10*3/uL (ref 150–400)
RBC: 4.75 MIL/uL (ref 3.87–5.11)
RDW: 13.4 % (ref 11.5–15.5)
WBC: 8.8 10*3/uL (ref 4.0–10.5)

## 2015-11-21 LAB — URINALYSIS, ROUTINE W REFLEX MICROSCOPIC
GLUCOSE, UA: NEGATIVE mg/dL
Ketones, ur: >80 mg/dL — AB
Leukocytes, UA: NEGATIVE
NITRITE: NEGATIVE
PH: 6 (ref 5.0–8.0)
Protein, ur: 30 mg/dL — AB
SPECIFIC GRAVITY, URINE: 1.04 — AB (ref 1.005–1.030)

## 2015-11-21 LAB — BASIC METABOLIC PANEL
Anion gap: 13 (ref 5–15)
BUN: 6 mg/dL (ref 6–20)
CHLORIDE: 97 mmol/L — AB (ref 101–111)
CO2: 28 mmol/L (ref 22–32)
CREATININE: 0.72 mg/dL (ref 0.44–1.00)
Calcium: 9.1 mg/dL (ref 8.9–10.3)
GFR calc Af Amer: >60 mL/min (ref >60)
GFR calc non Af Amer: >60 mL/min (ref >60)
Glucose, Bld: 86 mg/dL (ref 65–99)
Potassium: 2.8 mmol/L — ABNORMAL LOW (ref 3.5–5.1)
SODIUM: 138 mmol/L (ref 135–145)

## 2015-11-21 LAB — COMPREHENSIVE METABOLIC PANEL
ALK PHOS: 50 U/L (ref 38–126)
ALT: 17 U/L (ref 14–54)
ANION GAP: 20 — AB (ref 5–15)
AST: 20 U/L (ref 15–41)
Albumin: 4.7 g/dL (ref 3.5–5.0)
BILIRUBIN TOTAL: 1.6 mg/dL — AB (ref 0.3–1.2)
BUN: 9 mg/dL (ref 6–20)
CHLORIDE: 90 mmol/L — AB (ref 101–111)
CO2: 29 mmol/L (ref 22–32)
Calcium: 10 mg/dL (ref 8.9–10.3)
Creatinine, Ser: 0.79 mg/dL (ref 0.44–1.00)
GLUCOSE: 84 mg/dL (ref 65–99)
POTASSIUM: 3 mmol/L — AB (ref 3.5–5.1)
Sodium: 139 mmol/L (ref 135–145)
Total Protein: 8.1 g/dL (ref 6.5–8.1)

## 2015-11-21 LAB — URINE MICROSCOPIC-ADD ON

## 2015-11-21 LAB — TROPONIN I: Troponin I: <0.03 ng/mL (ref <0.031)

## 2015-11-21 LAB — LIPASE, BLOOD: LIPASE: 18 U/L (ref 11–51)

## 2015-11-21 MED ORDER — HALOPERIDOL LACTATE 5 MG/ML IJ SOLN
2.0000 mg | Freq: Once | INTRAMUSCULAR | Status: AC
  Administered 2015-11-21: 2 mg via INTRAVENOUS
  Filled 2015-11-21: qty 1

## 2015-11-21 MED ORDER — PANTOPRAZOLE SODIUM 40 MG PO TBEC
40.0000 mg | DELAYED_RELEASE_TABLET | Freq: Once | ORAL | Status: AC
  Administered 2015-11-21: 40 mg via ORAL
  Filled 2015-11-21: qty 1

## 2015-11-21 MED ORDER — DEXTROSE-NACL 5-0.45 % IV SOLN
INTRAVENOUS | Status: DC
  Administered 2015-11-21: 1000 mL via INTRAVENOUS

## 2015-11-21 MED ORDER — DEXTROSE 5 % IN LACTATED RINGERS IV BOLUS: 1000.0000 mL | Freq: Once | INTRAVENOUS | Status: DC

## 2015-11-21 MED ORDER — PROMETHAZINE HCL 25 MG RE SUPP
25.0000 mg | Freq: Four times a day (QID) | RECTAL | Status: DC | PRN
  Administered 2015-11-22 – 2015-11-23 (×4): 25 mg via RECTAL
  Filled 2015-11-21 (×7): qty 1

## 2015-11-21 MED ORDER — ONDANSETRON HCL 4 MG/2ML IJ SOLN
4.0000 mg | Freq: Once | INTRAMUSCULAR | Status: AC
  Administered 2015-11-21: 4 mg via INTRAVENOUS
  Filled 2015-11-21: qty 2

## 2015-11-21 MED ORDER — MAGNESIUM SULFATE 2 GM/50ML IV SOLN
2.0000 g | Freq: Once | INTRAVENOUS | Status: AC
  Administered 2015-11-21: 2 g via INTRAVENOUS
  Filled 2015-11-21: qty 50

## 2015-11-21 MED ORDER — GI COCKTAIL ~~LOC~~
30.0000 mL | Freq: Once | ORAL | Status: AC
  Administered 2015-11-21: 30 mL via ORAL
  Filled 2015-11-21: qty 30

## 2015-11-21 MED ORDER — SODIUM CHLORIDE 0.9 % IV BOLUS (SEPSIS)
1000.0000 mL | Freq: Once | INTRAVENOUS | Status: AC
  Administered 2015-11-21: 1000 mL via INTRAVENOUS

## 2015-11-21 MED ORDER — DEXTROSE-NACL 5-0.45 % IV SOLN
INTRAVENOUS | Status: AC
  Administered 2015-11-21: 13:00:00 via INTRAVENOUS

## 2015-11-21 MED ORDER — POTASSIUM CHLORIDE CRYS ER 20 MEQ PO TBCR
30.0000 meq | EXTENDED_RELEASE_TABLET | Freq: Once | ORAL | Status: AC
  Administered 2015-11-21: 30 meq via ORAL
  Filled 2015-11-21: qty 2

## 2015-11-21 MED ORDER — DEXTROSE-NACL 5-0.45 % IV SOLN
INTRAVENOUS | Status: AC
  Administered 2015-11-21: 20:00:00 via INTRAVENOUS

## 2015-11-21 MED ORDER — POTASSIUM CHLORIDE 10 MEQ/100ML IV SOLN
10.0000 meq | INTRAVENOUS | Status: AC
  Administered 2015-11-21 (×3): 10 meq via INTRAVENOUS
  Filled 2015-11-21 (×3): qty 100

## 2015-11-21 MED ORDER — SODIUM CHLORIDE 0.9 % IV SOLN
8.0000 mg | Freq: Once | INTRAVENOUS | Status: AC
  Administered 2015-11-21 (×2): 8 mg via INTRAVENOUS
  Filled 2015-11-21: qty 4

## 2015-11-21 NOTE — ED Notes (Signed)
Pt reports gastritis since October with recurrent exacerbations. Has had n/v since Sunday with some blood. Was seen here on Sunday. Pt is a x 4.

## 2015-11-21 NOTE — ED Notes (Signed)
The pt has started vomitinhg again after the po potassium was given

## 2015-11-21 NOTE — ED Notes (Signed)
Pt getting potassium run the last of 3  Sleeping since haldol  Report given by  The 7-3 rn

## 2015-11-21 NOTE — H&P (Signed)
[facility] History and Physical  Linda PierKeyandria Koch ZOX:[id number] DOB: Jun 17, 1991 DOA: 11/21/2015  Referring physician: Dr. Dalene SeltzerSchlossman PCP: No primary care provider on file.  Specialists:  none  24  G4 P1 with recent medical abortion 10/09/15 Prior history of paroxysmal A. fib which converted on its own Known history of adjustment disorder initially presented [facility] 11/18/2015 with nausea vomiting and discomfort in the abdomen after drinking 4 shots She was sent home but never really got better she states that she's been throwing up incessantly When she came to the emergency room she complained of chest pain and nausea vomiting every time she tried to eat and was taking Phenergan but cannot keep it down consistently  Found in emergency room to have anion gap acidosis with anion gap of 20 CO2 was normal however and ketones in her urine   two-view x-ray was normal Emergency room workup and management revealed that she got Zofran, Protonix, Haldol and Phenergan but this did not help and she still continued to vomit despite by mouth trials and she was referred for admission   Denies fever, chills, dysuria, discharge, other issues This is her first cycle since her therapeutic abortion 10/01/15 which is lasted about one week     Past Medical History  Diagnosis Date  . Chlamydia    Past Surgical History  Procedure Laterality Date  . No past surgeries    . Cesarean section N/A 03/29/2014    Procedure: CESAREAN SECTION;  Surgeon: Loney LaurenceMichelle A Horvath, MD;  Location: [facility];  Service: Obstetrics;  Laterality: N/A;   Social History:  Social History   Social History Narrative   Lives c BF and daughter   G4p1       No Known Allergies  Family History  Problem Relation Age of Onset  . Heart disease Maternal Grandfather   . Bipolar disorder Mother   . Schizophrenia Father   Medication Sig Start Date End Date Taking? Authorizing Provider  acetaminophen  (TYLENOL) 500 MG tablet Take 500 mg by mouth every 6 (six) hours as needed for moderate pain.   Yes Historical Provider, MD  calcium carbonate (TUMS - DOSED IN MG ELEMENTAL CALCIUM) 500 MG chewable tablet Chew 1 tablet by mouth daily as needed for indigestion or heartburn.   Yes Historical Provider, MD  esomeprazole (NEXIUM) 40 MG capsule Take 1 capsule (40 mg total) by mouth daily. 11/18/15  Yes Richardean Canalavid H Yao, MD  famotidine (PEPCID) 20 MG tablet Take 1 tablet (20 mg total) by mouth 2 (two) times daily as needed for heartburn or indigestion. 11/18/15  Yes Richardean Canalavid H Yao, MD  ondansetron (ZOFRAN) 8 MG tablet Take 8 mg by mouth every 8 (eight) hours as needed for nausea or vomiting.   Yes Historical Provider, MD  promethazine (PHENERGAN) 12.5 MG tablet Take 1 tablet (12.5 mg total) by mouth every 6 (six) hours as needed for nausea or vomiting. 11/18/15  Yes Richardean Canalavid H Yao, MD  aspirin EC 81 MG EC tablet Take 1 tablet (81 mg total) by mouth daily. Patient not taking: Reported on 11/21/2015 07/04/15   John GiovanniVasundhra Rathore, MD  pantoprazole (PROTONIX) 20 MG tablet Take 1 tablet (20 mg total) by mouth daily. Patient not taking: Reported on 11/21/2015 05/30/15   Loren Raceravid Yelverton, MD   Physical Exam: Filed Vitals:   11/21/15 1600 11/21/15 1645 11/21/15 1715 11/21/15 1730  BP: 95/57 111/76 121/77 136/90  Pulse: 81 79 70 80  Temp:      TempSrc:  Resp: SpO2: 98% 99% 98% 98%   Alert pleasant oriented dry mucosa S1-S2 regular rate rhythm No pallor no icterus Chest clinically clear No abdominal tenderness however epigastric discomfort noted on light pressure No back pain no CVA tenderness No lower extremity edema   Labs on Admission:  Basic Metabolic Panel:  Recent Labs Lab 11/18/15 0627 11/21/15 1050  NA 142 139  K 3.4* 3.0*  CL 106 90*  CO2 23 29  GLUCOSE 111* 84  BUN <5* 9  CREATININE 0.61 0.79  CALCIUM 9.2 10.0   Liver Function Tests:  Recent Labs Lab 11/18/15 0627  11/21/15 1050  AST 18 20  ALT 13* 17  ALKPHOS 45 50  BILITOT 1.0 1.6*  PROT 7.2 8.1  ALBUMIN 4.1 4.7    Recent Labs Lab 11/18/15 0627 11/21/15 1050  LIPASE 22 18   No results for input(s): AMMONIA in the last 168 hours. CBC:  Recent Labs Lab 11/18/15 0627 11/21/15 1050  WBC 5.5 8.8  NEUTROABS 3.1  --   HGB 13.1 14.9  HCT 38.7 44.3  MCV 94.2 93.3  PLT 296 270   Cardiac Enzymes:  Recent Labs Lab 11/21/15 1050  TROPONINI <0.03    BNP (last 3 results) No results for input(s): BNP in the last 8760 hours.  ProBNP (last 3 results) No results for input(s): PROBNP in the last 8760 hours.  CBG: No results for input(s): GLUCAP in the last 168 hours.  Radiological Exams on Admission: Dg Chest 2 View  11/21/2015  CLINICAL DATA:  Chest pain after vomiting EXAM: CHEST  2 VIEW COMPARISON:  05/30/2015 FINDINGS: Normal heart size and mediastinal contours. No visible pneumomediastinum or chest wall gas. No infiltrate or edema. No effusion or pneumothorax. No acute osseous findings. IMPRESSION: Negative chest. Electronically Signed   By: Marnee Spring M.D.   On: 11/21/2015 13:12      Assessment/Plan Principal Problem:   Intractable vomiting -blood Beta-hCG is negative ruling out pregnancy -Phenergan suppository -D5.45 saline 100 cc per hour for now Recheck magnesium in a.m. Replace potassium with K Dur 30 mEq -Expect should be able to discharge home in a.m.? Active Problems:   h/o of P Atrial fibrillation with RVR-now benign   -does not need telemetry as is in sinus rhythm   Mild tetrahydrocannabinol (THC) abuse   Adjustment disorder NOS Needs to follow-up with her psychiatrist-  recent therapeutic abortion  - outpatient management      Time spent: 45 Full code Home 4/13 if labs stable and patient tol po  Mahala Menghini [facility] Pager [phone]  If 7PM-7AM, please contact night-coverage [email or web address] Password [facility] 11/21/2015, 6:41  PM

## 2015-11-21 NOTE — ED Notes (Signed)
The pt is actively vomiting at present

## 2015-11-21 NOTE — ED Notes (Signed)
Pt aware of urine sample needed pt unable to go at this time

## 2015-11-21 NOTE — ED Provider Notes (Signed)
CSN: [id number]     Arrival date & time 11/21/15  1020 History   First MD Initiated Contact with Patient 11/21/15 1024     Chief Complaint  Patient presents with  . Emesis     (Consider location/radiation/quality/duration/timing/severity/associated sxs/prior Treatment) HPI Comments: Chest pain middle, radiates to back, 8/10 Soreness, worse with burping/breathing/swallowing Diagnosed with gastritis 3 days ago Had it before and feels the same  N/v every time try to eat anything Smoke marijuana but not lately Taking phenergan without relief Sometimes can keep phenergan down   Patient is a 25 y.o. female presenting with vomiting.  Emesis Duration:  1 day Associated symptoms: no abdominal pain, no headaches and no sore throat     Past Medical History  Diagnosis Date  . Chlamydia    Past Surgical History  Procedure Laterality Date  . No past surgeries    . Cesarean section N/A 03/29/2014    Procedure: CESAREAN SECTION;  Surgeon: Loney Laurence, MD;  Location: [facility];  Service: Obstetrics;  Laterality: N/A;   Family History  Problem Relation Age of Onset  . Heart disease Maternal Grandfather    Social History  Substance Use Topics  . Smoking status: Former Games developer  . Smokeless tobacco: Never Used     Comment: quit iearly 2015  . Alcohol Use: Yes     Comment: rare, last was Christmas   OB History    Gravida Para Term Preterm AB TAB SAB Ectopic Multiple Living   Review of Systems  Constitutional: Negative for fever.  HENT: Negative for sore throat.   Eyes: Negative for visual disturbance.  Respiratory: Positive for shortness of breath. Negative for cough.   Cardiovascular: Positive for chest pain.  Gastrointestinal: Positive for nausea and vomiting. Negative for abdominal pain.  Genitourinary: Positive for vaginal bleeding. Negative for dysuria and difficulty urinating.  Musculoskeletal: Negative for back pain and neck pain.  Skin: Negative  for rash.  Neurological: Positive for light-headedness. Negative for syncope and headaches.      Allergies  Review of patient's allergies indicates no known allergies.  Home Medications   Prior to Admission medications   Medication Sig Start Date End Date Taking? Authorizing Provider  acetaminophen (TYLENOL) 500 MG tablet Take 500 mg by mouth every 6 (six) hours as needed for moderate pain.   Yes Historical Provider, MD  calcium carbonate (TUMS - DOSED IN MG ELEMENTAL CALCIUM) 500 MG chewable tablet Chew 1 tablet by mouth daily as needed for indigestion or heartburn.   Yes Historical Provider, MD  esomeprazole (NEXIUM) 40 MG capsule Take 1 capsule (40 mg total) by mouth daily. 11/18/15  Yes Richardean Canal, MD  famotidine (PEPCID) 20 MG tablet Take 1 tablet (20 mg total) by mouth 2 (two) times daily as needed for heartburn or indigestion. 11/18/15  Yes Richardean Canal, MD  ondansetron (ZOFRAN) 8 MG tablet Take 8 mg by mouth every 8 (eight) hours as needed for nausea or vomiting.   Yes Historical Provider, MD  promethazine (PHENERGAN) 12.5 MG tablet Take 1 tablet (12.5 mg total) by mouth every 6 (six) hours as needed for nausea or vomiting. 11/18/15  Yes Richardean Canal, MD  aspirin EC 81 MG EC tablet Take 1 tablet (81 mg total) by mouth daily. Patient not taking: Reported on 11/21/2015 07/04/15   John Giovanni, MD  pantoprazole (PROTONIX) 20 MG tablet Take 1 tablet (20 mg  total) by mouth daily. Patient not taking: Reported on 11/21/2015 05/30/15   Loren Racer, MD   BP 136/90 mmHg  Pulse 80  Temp(Src) 98.7 F (37.1 C) (Oral)  Resp 15  SpO2 98%  LMP 11/13/2015  Breastfeeding? No Physical Exam  Constitutional: She is oriented to person, place, and time. She appears well-developed and well-nourished. She appears ill (fatigued, nauseas). No distress.  HENT:  Head: Normocephalic and atraumatic.  Mouth/Throat: Mucous membranes are dry.  Eyes: Conjunctivae and EOM are normal.  Neck: Normal range of  motion.  Cardiovascular: Normal rate, regular rhythm, normal heart sounds and intact distal pulses.  Exam reveals no gallop and no friction rub.   No murmur heard. Pulmonary/Chest: Effort normal and breath sounds normal. No respiratory distress. She has no wheezes. She has no rales.  Abdominal: Soft. She exhibits no distension. There is no tenderness. There is no guarding and no CVA tenderness.  Musculoskeletal: She exhibits no edema or tenderness.  Neurological: She is alert and oriented to person, place, and time.  Skin: Skin is warm and dry. No rash noted. She is not diaphoretic. No erythema.  Nursing note and vitals reviewed.   ED Course  Procedures (including critical care time) Labs Review Labs Reviewed  COMPREHENSIVE METABOLIC PANEL - Abnormal; Notable for the following:    Potassium 3.0 (*)    Chloride 90 (*)    Total Bilirubin 1.6 (*)    Anion gap 20 (*)    All other components within normal limits  URINALYSIS, ROUTINE W REFLEX MICROSCOPIC (NOT AT [facility]) - Abnormal; Notable for the following:    Color, Urine AMBER (*)    APPearance CLOUDY (*)    Specific Gravity, Urine 1.040 (*)    Hgb urine dipstick MODERATE (*)    Bilirubin Urine SMALL (*)    Ketones, ur >80 (*)    Protein, ur 30 (*)    All other components within normal limits  URINE MICROSCOPIC-ADD ON - Abnormal; Notable for the following:    Squamous Epithelial / LPF 6-30 (*)    Bacteria, UA MANY (*)    All other components within normal limits  LIPASE, BLOOD  CBC  TROPONIN I  BASIC METABOLIC PANEL  I-STAT BETA HCG BLOOD, ED ([facility], [facility], [facility] ONLY)    Imaging Review Dg Chest 2 View  11/21/2015  CLINICAL DATA:  Chest pain after vomiting EXAM: CHEST  2 VIEW COMPARISON:  05/30/2015 FINDINGS: Normal heart size and mediastinal contours. No visible pneumomediastinum or chest wall gas. No infiltrate or edema. No effusion or pneumothorax. No acute osseous findings. IMPRESSION: Negative chest. Electronically Signed   By:  Marnee Spring M.D.   On: 11/21/2015 13:12   I have personally reviewed and evaluated these images and lab results as part of my medical decision-making.   EKG Interpretation   Date/Time:  Wednesday November 21 2015 12:12:47 EDT Ventricular Rate:  71 PR Interval:  139 QRS Duration: 79 QT Interval:  371 QTC Calculation: 403 R Axis:   79 Text Interpretation:  Sinus rhythm No longer in atrial fibrillation  Confirmed by [facility] MD, Avonda Toso ([id number]) on 11/21/2015 6:27:01 PM      MDM   Final diagnoses:  Dehydration  Intractable vomiting with nausea, vomiting of unspecified type   25 year old female with history of presentation to the emergency department 3 days ago for concern of nausea and vomiting presents with concern for continued nausea, vomiting and pain with swallowing.   Given patient reported some pain in the  chest, EKG and x-ray were obtained which showed no acute findings. Troponin is negative. Pain the chest is likely esophagitis related to a  vomiting. Doubt boorhaave's given XR and duration of symptoms. Patient with a benign abdominal exam and have low suspicion for appendicitis, diverticulitis, or obstruction. She denies any headache or head trauma and have low suspicion for intracranial cause of patient's emesis. Urinalysis appears contaminated, however does not appear infected. Pt without DM, glucose normal and ketones in urine likely secondary to starvation ketoacidosis. This also may be etiology of anion gap.  Patient with history of emesis which she reports was diagnosed as gastritis in the past including 3 days ago and feels this is similar.  Patient left initially given normal saline and IV Zofran, however continued to have nausea and vomiting. She is given 2 mg of IV Haldol, initially with improvement of her nausea and vomiting, and was briefly able to tolerate by mouth ginger ale. Pt requested crackers and again developed severe nausea and vomiting following this.  Pt  received IV normal saline bolus, D5.45NS bolus, placed on D5.45 maintenance, 30 meq IV potassium over 3 hr, 30meq k which was vomited.    Given intractable nausea, vomiting, pt given 8mg  IV zofran and paged for admission for rehydration. Emesis likely secondary to etoh gastritis, possible cannabinoid hyperemesis however pt denies any recent use.       Alvira MondayErin Lavalle Skoda, MD 11/21/15 [id number]

## 2015-11-21 NOTE — ED Notes (Signed)
Report called to 6 e Linda Messierkathy

## 2015-11-21 NOTE — ED Notes (Signed)
Patient transported to X-ray 

## 2015-11-21 NOTE — ED Notes (Signed)
Pt ambulated to the bathroom with ease 

## 2015-11-21 NOTE — ED Notes (Signed)
Last pot run infused

## 2015-11-22 ENCOUNTER — Encounter (HOSPITAL_COMMUNITY): Payer: Self-pay | Admitting: *Deleted

## 2015-11-22 DIAGNOSIS — R1110 Vomiting, unspecified: Secondary | ICD-10-CM | POA: Diagnosis not present

## 2015-11-22 LAB — COMPREHENSIVE METABOLIC PANEL
ALBUMIN: 3.3 g/dL — AB (ref 3.5–5.0)
ALT: 12 U/L — AB (ref 14–54)
AST: 15 U/L (ref 15–41)
Alkaline Phosphatase: 36 U/L — ABNORMAL LOW (ref 38–126)
Anion gap: 11 (ref 5–15)
CHLORIDE: 99 mmol/L — AB (ref 101–111)
CO2: 28 mmol/L (ref 22–32)
CREATININE: 0.77 mg/dL (ref 0.44–1.00)
Calcium: 8.7 mg/dL — ABNORMAL LOW (ref 8.9–10.3)
GFR calc Af Amer: >60 mL/min (ref >60)
Glucose, Bld: 94 mg/dL (ref 65–99)
POTASSIUM: 2.8 mmol/L — AB (ref 3.5–5.1)
SODIUM: 138 mmol/L (ref 135–145)
Total Bilirubin: 1.2 mg/dL (ref 0.3–1.2)
Total Protein: 5.9 g/dL — ABNORMAL LOW (ref 6.5–8.1)

## 2015-11-22 LAB — BASIC METABOLIC PANEL
ANION GAP: 11 (ref 5–15)
BUN: <5 mg/dL — ABNORMAL LOW (ref 6–20)
CALCIUM: 8.9 mg/dL (ref 8.9–10.3)
CHLORIDE: 104 mmol/L (ref 101–111)
CO2: 25 mmol/L (ref 22–32)
CREATININE: 0.68 mg/dL (ref 0.44–1.00)
GFR calc non Af Amer: >60 mL/min (ref >60)
Glucose, Bld: 95 mg/dL (ref 65–99)
Potassium: 3.9 mmol/L (ref 3.5–5.1)
SODIUM: 140 mmol/L (ref 135–145)

## 2015-11-22 LAB — PROTIME-INR
INR: 1.18 (ref 0.00–1.49)
PROTHROMBIN TIME: 15.2 s (ref 11.6–15.2)

## 2015-11-22 LAB — MAGNESIUM: Magnesium: 2.1 mg/dL (ref 1.7–2.4)

## 2015-11-22 MED ORDER — ENOXAPARIN SODIUM 40 MG/0.4ML ~~LOC~~ SOLN
40.0000 mg | SUBCUTANEOUS | Status: DC
  Administered 2015-11-22 – 2015-11-23 (×2): 40 mg via SUBCUTANEOUS
  Filled 2015-11-22 (×3): qty 0.4

## 2015-11-22 MED ORDER — ONDANSETRON 4 MG PO TBDP
4.0000 mg | ORAL_TABLET | Freq: Three times a day (TID) | ORAL | Status: DC | PRN
  Administered 2015-11-23: 4 mg via ORAL
  Filled 2015-11-22: qty 1

## 2015-11-22 MED ORDER — POTASSIUM CHLORIDE 2 MEQ/ML IV SOLN
INTRAVENOUS | Status: DC
  Administered 2015-11-22: 11:00:00 via INTRAVENOUS
  Filled 2015-11-22 (×3): qty 1000

## 2015-11-22 MED ORDER — ONDANSETRON HCL 4 MG/2ML IJ SOLN
4.0000 mg | Freq: Once | INTRAMUSCULAR | Status: AC
  Administered 2015-11-22: 4 mg via INTRAVENOUS
  Filled 2015-11-22: qty 2

## 2015-11-22 MED ORDER — FAMOTIDINE IN NACL 20-0.9 MG/50ML-% IV SOLN
20.0000 mg | Freq: Two times a day (BID) | INTRAVENOUS | Status: DC
  Administered 2015-11-22 – 2015-11-25 (×8): 20 mg via INTRAVENOUS
  Filled 2015-11-22 (×10): qty 50

## 2015-11-22 MED ORDER — PANTOPRAZOLE SODIUM 40 MG IV SOLR
40.0000 mg | Freq: Two times a day (BID) | INTRAVENOUS | Status: DC
  Administered 2015-11-22 – 2015-11-25 (×8): 40 mg via INTRAVENOUS
  Filled 2015-11-22 (×8): qty 40

## 2015-11-22 MED ORDER — POTASSIUM CHLORIDE 2 MEQ/ML IV SOLN
INTRAVENOUS | Status: DC
  Administered 2015-11-23: 18:00:00 via INTRAVENOUS
  Filled 2015-11-22 (×4): qty 1000

## 2015-11-22 MED ORDER — POTASSIUM CHLORIDE 2 MEQ/ML IV SOLN
INTRAVENOUS | Status: AC
  Administered 2015-11-22: 13:00:00 via INTRAVENOUS
  Filled 2015-11-22: qty 1000

## 2015-11-22 NOTE — Progress Notes (Signed)
PROGRESS NOTE    Linda Koch  EAV:[id number] DOB: [birth year] DOA: 11/21/2015 PCP: No primary care provider on file.  Outpatient Specialists: OB    Brief Narrative:  24 G4 P1 with recent medical abortion 10/09/15 Prior history of paroxysmal A. fib which converted on its own Known history of adjustment disorder initially presented to [facility] system 11/18/2015 with nausea vomiting and discomfort in the abdomen after drinking 4 shots  anion gap of 20 CO2 was normal however and ketones in her urine  Emergency room workup and management revealed that she got Zofran, Protonix, Haldol and Phenergan but this did not help and she still continued to vomit despite by mouth trials and she was referred for admission  Assessment & Plan:   Principal Problem:   Intractable vomiting Slow to resolve Soft diet odt zofran Rectal phenergan Cont PPI/Pepcid IV for now Active Problems:   h/o of P Atrial fibrillation with RVR-now benign stable   Severe hypokalemia K 2.8 repalce with IV 80 meq in d5 75 cc/hr Recheck labs freq Am magensium   Mild tetrahydrocannabinol (THC) abuse   Adjustment disorder NOS Op Pyshc input     DVT prophylaxis: Lovenox Code Status: Full Family Communication: none Disposition Plan: home when resolution occurs~ 2-3 days If no better-considering GI input in am     Subjective:  Still n/v Cannot tolerate soda-told her to drink juice Not able to eat much  Objective: Filed Vitals:   11/21/15 1830 11/21/15 2049 11/22/15 0545 11/22/15 0923  BP: 131/84 137/96 96/64 147/95  Pulse: 66 104 66 85  Temp:  98.4 F (36.9 C) 98 F (36.7 C) 98.5 F (36.9 C)  TempSrc:  Oral Oral Oral  Resp: Height:   (1.6 m)    Weight:  61.5 kg (135 lb 9.3 oz)    SpO2: 97% 98% 100% 100%    Intake/Output Summary (Last 24 hours) at 11/22/15 1556 Last data filed at 11/22/15 0900  Gross per 24 hour  Intake   1440 ml  Output      1 ml  Net   1439  ml   Filed Weights   11/21/15 2049  Weight: 61.5 kg (135 lb 9.3 oz)    Examination:  General exam: Appears calm and comfortable  Respiratory system: Clear to auscultation. Respiratory effort normal. Cardiovascular system: S1 & S2 heard, RRR. No JVD, murmurs, rubs, gallops or clicks. No pedal edema. Gastrointestinal system: Abdomen is nondistended, soft and nontender. No organomegaly or masses felt. Normal bowel sounds heard. Central nervous system: Alert and oriented. No focal neurological deficits.     Data Reviewed: I have personally reviewed following labs and imaging studies  CBC:  Recent Labs Lab 11/18/15 0627 11/21/15 1050  WBC 5.5 8.8  NEUTROABS 3.1  --   HGB 13.1 14.9  HCT 38.7 44.3  MCV 94.2 93.3  PLT 296 270   Basic Metabolic Panel:  Recent Labs Lab 11/18/15 0627 11/21/15 1050 11/21/15 1909 11/22/15 0654  NA 142 139 138 138  K 3.4* 3.0* 2.8* 2.8*  CL 106 90* 97* 99*  CO2 GLUCOSE 111* 84 86 94  BUN <5* 9 6 <5*  CREATININE 0.61 0.79 0.72 0.77  CALCIUM 9.2 10.0 9.1 8.7*  MG  --   --   --  2.1   GFR: Estimated Creatinine Clearance: 90.5 mL/min (by C-G formula based on Cr of 0.77). Liver Function Tests:  Recent Labs Lab 11/18/15  [id number] 11/21/15 1050 11/22/15 0654  AST 18 20 15   ALT 13* 17 12*  ALKPHOS 45 50 36*  BILITOT 1.0 1.6* 1.2  PROT 7.2 8.1 5.9*  ALBUMIN 4.1 4.7 3.3*    Recent Labs Lab 11/18/15 0627 11/21/15 1050  LIPASE 22 18   No results for input(s): AMMONIA in the last 168 hours. Coagulation Profile:  Recent Labs Lab 11/22/15 0654  INR 1.18   Cardiac Enzymes:  Recent Labs Lab 11/21/15 1050  TROPONINI <0.03   BNP (last 3 results) No results for input(s): PROBNP in the last 8760 hours. HbA1C: No results for input(s): HGBA1C in the last 72 hours. CBG: No results for input(s): GLUCAP in the last 168 hours. Lipid Profile: No results for input(s): CHOL, HDL, LDLCALC, TRIG, CHOLHDL, LDLDIRECT in the  last 72 hours. Thyroid Function Tests: No results for input(s): TSH, T4TOTAL, FREET4, T3FREE, THYROIDAB in the last 72 hours. Anemia Panel: No results for input(s): VITAMINB12, FOLATE, FERRITIN, TIBC, IRON, RETICCTPCT in the last 72 hours. Urine analysis:    Component Value Date/Time   COLORURINE AMBER* 11/21/2015 1200   APPEARANCEUR CLOUDY* 11/21/2015 1200   LABSPEC 1.040* 11/21/2015 1200   PHURINE 6.0 11/21/2015 1200   GLUCOSEU NEGATIVE 11/21/2015 1200   HGBUR MODERATE* 11/21/2015 1200   BILIRUBINUR SMALL* 11/21/2015 1200   KETONESUR >80* 11/21/2015 1200   PROTEINUR 30* 11/21/2015 1200   UROBILINOGEN 0.2 10/12/2014 1628   NITRITE NEGATIVE 11/21/2015 1200   LEUKOCYTESUR NEGATIVE 11/21/2015 1200   Sepsis Labs: @LABRCNTIP (procalcitonin:4,lacticidven:4)  )No results found for this or any previous visit (from the past 240 hour(s)).       Radiology Studies: Dg Chest 2 View  11/21/2015  CLINICAL DATA:  Chest pain after vomiting EXAM: CHEST  2 VIEW COMPARISON:  05/30/2015 FINDINGS: Normal heart size and mediastinal contours. No visible pneumomediastinum or chest wall gas. No infiltrate or edema. No effusion or pneumothorax. No acute osseous findings. IMPRESSION: Negative chest. Electronically Signed   By: Marnee SpringJonathon  Watts M.D.   On: 11/21/2015 13:12        Scheduled Meds: . famotidine (PEPCID) IV  20 mg Intravenous Q12H  . pantoprazole (PROTONIX) IV  40 mg Intravenous Q12H   Continuous Infusions: . dextrose 5 % and 0.9% NaCl 1,000 mL with potassium chloride 80 mEq infusion 75 mL/hr at 11/22/15 1322  . dextrose 5 % and 0.9% NaCl 1,000 mL with potassium chloride 80 mEq infusion          Time spent: 15    Pleas KochJai Steffany Schoenfelder, MD [facility] ([facility]) [phone]   If 7PM-7AM, please contact night-coverage [email or web address] Password [email or web address] 11/22/2015, 3:56 PM

## 2015-11-22 NOTE — Progress Notes (Signed)
7am-11am RN stated she will chart assessment before leaving for the day. Full assessment not charted by this RN (11am-7pm shift) 

## 2015-11-23 DIAGNOSIS — R112 Nausea with vomiting, unspecified: Secondary | ICD-10-CM | POA: Diagnosis not present

## 2015-11-23 DIAGNOSIS — F1210 Cannabis abuse, uncomplicated: Secondary | ICD-10-CM | POA: Diagnosis present

## 2015-11-23 DIAGNOSIS — F4320 Adjustment disorder, unspecified: Secondary | ICD-10-CM | POA: Diagnosis present

## 2015-11-23 DIAGNOSIS — E876 Hypokalemia: Secondary | ICD-10-CM | POA: Diagnosis present

## 2015-11-23 DIAGNOSIS — Z79899 Other long term (current) drug therapy: Secondary | ICD-10-CM | POA: Diagnosis not present

## 2015-11-23 DIAGNOSIS — E872 Acidosis: Secondary | ICD-10-CM | POA: Diagnosis present

## 2015-11-23 DIAGNOSIS — Z7982 Long term (current) use of aspirin: Secondary | ICD-10-CM | POA: Diagnosis not present

## 2015-11-23 DIAGNOSIS — I4891 Unspecified atrial fibrillation: Secondary | ICD-10-CM | POA: Diagnosis not present

## 2015-11-23 DIAGNOSIS — R1110 Vomiting, unspecified: Secondary | ICD-10-CM | POA: Diagnosis not present

## 2015-11-23 DIAGNOSIS — I499 Cardiac arrhythmia, unspecified: Secondary | ICD-10-CM | POA: Diagnosis not present

## 2015-11-23 LAB — COMPREHENSIVE METABOLIC PANEL
ALBUMIN: 4.2 g/dL (ref 3.5–5.0)
ALK PHOS: 41 U/L (ref 38–126)
ALT: 14 U/L (ref 14–54)
AST: 16 U/L (ref 15–41)
Anion gap: 13 (ref 5–15)
BILIRUBIN TOTAL: 1.1 mg/dL (ref 0.3–1.2)
CALCIUM: 9.4 mg/dL (ref 8.9–10.3)
CO2: 25 mmol/L (ref 22–32)
Chloride: 104 mmol/L (ref 101–111)
Creatinine, Ser: 0.8 mg/dL (ref 0.44–1.00)
GFR calc Af Amer: >60 mL/min (ref >60)
GFR calc non Af Amer: >60 mL/min (ref >60)
GLUCOSE: 98 mg/dL (ref 65–99)
Potassium: 3.4 mmol/L — ABNORMAL LOW (ref 3.5–5.1)
Sodium: 142 mmol/L (ref 135–145)
TOTAL PROTEIN: 7.6 g/dL (ref 6.5–8.1)

## 2015-11-23 MED ORDER — PROCHLORPERAZINE EDISYLATE 5 MG/ML IJ SOLN
10.0000 mg | INTRAMUSCULAR | Status: DC
  Administered 2015-11-23 – 2015-11-25 (×12): 10 mg via INTRAVENOUS
  Filled 2015-11-23 (×12): qty 2

## 2015-11-23 MED ORDER — DICYCLOMINE HCL 20 MG PO TABS
20.0000 mg | ORAL_TABLET | Freq: Three times a day (TID) | ORAL | Status: DC
  Administered 2015-11-23 – 2015-11-25 (×9): 20 mg via ORAL
  Filled 2015-11-23 (×4): qty 1

## 2015-11-23 MED ORDER — ONDANSETRON HCL 4 MG/2ML IJ SOLN
4.0000 mg | Freq: Four times a day (QID) | INTRAMUSCULAR | Status: DC
  Administered 2015-11-23: 4 mg via INTRAVENOUS
  Filled 2015-11-23: qty 2

## 2015-11-23 NOTE — Progress Notes (Signed)
Patient states she had a medium, hard stool, but did not save for assessment.  Continues to have nausea, but states has dissipated somewhat since use of phenergan suppository.  Earlier had vomited undigested food (applesause and ginger ale).

## 2015-11-23 NOTE — Progress Notes (Signed)
PROGRESS NOTE    Linda Koch  WUJ:[id number] DOB: [birth year] DOA: 11/21/2015 PCP: No primary care provider on file.  Outpatient Specialists: OB    Brief Narrative:  24 G4 P1 with recent medical abortion 10/09/15 Prior history of paroxysmal A. fib which converted on its own Known history of adjustment disorder initially presented to [facility] health system 11/18/2015 with nausea vomiting and discomfort in the abdomen after drinking 4 shots  anion gap of 20 CO2 was normal however and ketones in her urine  Emergency room workup and management revealed that she got Zofran, Protonix, Haldol and Phenergan but this did not help and she still continued to vomit despite by mouth trials and she was referred for admission  Assessment & Plan:   Principal Problem:   Intractable vomiting Had abortion feb 2017--no intermittent vomiting interesting to note was n at time of first menses ~ time of admission--? Adenomyosis? Slow to resolve Advised 1 tsp liquid, then 20 min later 2 tsp then 20 min later 3 tsp--etc etc to build up capacity of sotmach Soft diet odt zofran-->IV zofran + phenergan Cont PPI/Pepcid IV for now If no better get GI opinion Active Problems:   h/o of P Atrial fibrillation with RVR-now benign stable   Severe hypokalemia K 2.8-->3.4 repalce with IV 80 meq in d5 75 cc/hr Recheck labs am Am magensium   Mild tetrahydrocannabinol (THC) abuse   Adjustment disorder NOS Op Pyshc input     DVT prophylaxis: Lovenox Code Status: Full Family Communication: none Disposition Plan: home when resolution occurs~ 2-3 days If no better-considering GI input in am     Subjective:  Still n/v Had some cran juice-tried lunch but vomited Not that has been n/v since pregnancy-->till OCt/nov 2016--got better.  Stopped after that   Objective: Filed Vitals:   11/22/15 2015 11/23/15 0422 11/23/15 0851 11/23/15 1617  BP: 148/77 95/63 142/100 140/112  Pulse: 63 60 70 84    Temp: 100.4 F (38 C) 98.4 F (36.9 C) 98.2 F (36.8 C) 98.4 F (36.9 C)  TempSrc:   Oral Oral  Resp: Height:      Weight:      SpO2: 100% 99% 100% 100%    Intake/Output Summary (Last 24 hours) at 11/23/15 1817 Last data filed at 11/23/15 1618  Gross per 24 hour  Intake 2195.33 ml  Output      3 ml  Net 2192.33 ml   Filed Weights   11/21/15 2049  Weight: 61.5 kg (135 lb 9.3 oz)    Examination:  General exam: comfortable  Respiratory system: Clear to auscultation. Respiratory effort normal. Cardiovascular system: S1 & S2 heard, RRR. No JVD, murmurs, rubs, gallops or clicks. No pedal edema. Gastrointestinal system: Abdomen is nondistended, slight epignontender. No organomegaly or masses felt. Normal bowel sounds heard. Central nervous system: Alert and oriented. No focal neurological deficits.     Data Reviewed: I have personally reviewed following labs and imaging studies  CBC:  Recent Labs Lab 11/18/15 0627 11/21/15 1050  WBC 5.5 8.8  NEUTROABS 3.1  --   HGB 13.1 14.9  HCT 38.7 44.3  MCV 94.2 93.3  PLT 296 270   Basic Metabolic Panel:  Recent Labs Lab 11/21/15 1050 11/21/15 1909 11/22/15 0654 11/22/15 1626 11/23/15 1059  NA 139 138 138 140 142  K 3.0* 2.8* 2.8* 3.9 3.4*  CL 90* 97* 99* 104 104  CO2 GLUCOSE 84 86 94  95 98  BUN 9 6 <5* <5* <5*  CREATININE 0.79 0.72 0.77 0.68 0.80  CALCIUM 10.0 9.1 8.7* 8.9 9.4  MG  --   --  2.1  --   --    GFR: Estimated Creatinine Clearance: 90.5 mL/min (by C-G formula based on Cr of 0.8). Liver Function Tests:  Recent Labs Lab 11/18/15 0627 11/21/15 1050 11/22/15 0654 11/23/15 1059  AST 18 20 15 16   ALT 13* 17 12* 14  ALKPHOS 45 50 36* 41  BILITOT 1.0 1.6* 1.2 1.1  PROT 7.2 8.1 5.9* 7.6  ALBUMIN 4.1 4.7 3.3* 4.2    Recent Labs Lab 11/18/15 0627 11/21/15 1050  LIPASE 22 18   No results for input(s): AMMONIA in the last 168 hours. Coagulation Profile:  Recent  Labs Lab 11/22/15 0654  INR 1.18   Cardiac Enzymes:  Recent Labs Lab 11/21/15 1050  TROPONINI <0.03   BNP (last 3 results) No results for input(s): PROBNP in the last 8760 hours. HbA1C: No results for input(s): HGBA1C in the last 72 hours. CBG: No results for input(s): GLUCAP in the last 168 hours. Lipid Profile: No results for input(s): CHOL, HDL, LDLCALC, TRIG, CHOLHDL, LDLDIRECT in the last 72 hours. Thyroid Function Tests: No results for input(s): TSH, T4TOTAL, FREET4, T3FREE, THYROIDAB in the last 72 hours. Anemia Panel: No results for input(s): VITAMINB12, FOLATE, FERRITIN, TIBC, IRON, RETICCTPCT in the last 72 hours. Urine analysis:    Component Value Date/Time   COLORURINE AMBER* 11/21/2015 1200   APPEARANCEUR CLOUDY* 11/21/2015 1200   LABSPEC 1.040* 11/21/2015 1200   PHURINE 6.0 11/21/2015 1200   GLUCOSEU NEGATIVE 11/21/2015 1200   HGBUR MODERATE* 11/21/2015 1200   BILIRUBINUR SMALL* 11/21/2015 1200   KETONESUR >80* 11/21/2015 1200   PROTEINUR 30* 11/21/2015 1200   UROBILINOGEN 0.2 10/12/2014 1628   NITRITE NEGATIVE 11/21/2015 1200   LEUKOCYTESUR NEGATIVE 11/21/2015 1200   Sepsis Labs: @LABRCNTIP (procalcitonin:4,lacticidven:4)  )No results found for this or any previous visit (from the past 240 hour(s)).       Radiology Studies: No results found.      Scheduled Meds: . dicyclomine  20 mg Oral TID AC & HS  . enoxaparin (LOVENOX) injection  40 mg Subcutaneous Q24H  . famotidine (PEPCID) IV  20 mg Intravenous Q12H  . pantoprazole (PROTONIX) IV  40 mg Intravenous Q12H  . prochlorperazine  10 mg Intravenous Q4H   Continuous Infusions: . dextrose 5 % and 0.9% NaCl 1,000 mL with potassium chloride 80 mEq infusion 50 mL/hr at 11/23/15 1808        Time spent: 15    Pleas Linda Linda Santini, MD [facility] [phone]   If 7PM-7AM, please contact night-coverage [email or web address] Password [email or web address] 11/23/2015, 6:17 PM

## 2015-11-24 LAB — BASIC METABOLIC PANEL
Anion gap: 10 (ref 5–15)
CO2: 27 mmol/L (ref 22–32)
Calcium: 9.4 mg/dL (ref 8.9–10.3)
Chloride: 105 mmol/L (ref 101–111)
Creatinine, Ser: 0.95 mg/dL (ref 0.44–1.00)
GFR calc Af Amer: >60 mL/min (ref >60)
GFR calc non Af Amer: >60 mL/min (ref >60)
GLUCOSE: 96 mg/dL (ref 65–99)
Potassium: 3.6 mmol/L (ref 3.5–5.1)
Sodium: 142 mmol/L (ref 135–145)

## 2015-11-24 MED ORDER — POTASSIUM CHLORIDE 2 MEQ/ML IV SOLN
INTRAVENOUS | Status: DC
  Administered 2015-11-24: 10:00:00 via INTRAVENOUS
  Filled 2015-11-24 (×3): qty 1000

## 2015-11-24 MED ORDER — SUCRALFATE 1 GM/10ML PO SUSP
1.0000 g | Freq: Three times a day (TID) | ORAL | Status: DC
  Administered 2015-11-24 – 2015-11-25 (×5): 1 g via ORAL
  Filled 2015-11-24 (×5): qty 10

## 2015-11-24 MED ORDER — METOCLOPRAMIDE HCL 5 MG/ML IJ SOLN
10.0000 mg | Freq: Four times a day (QID) | INTRAMUSCULAR | Status: DC
  Administered 2015-11-24 – 2015-11-25 (×5): 10 mg via INTRAVENOUS
  Filled 2015-11-24 (×6): qty 2

## 2015-11-24 NOTE — Progress Notes (Signed)
PROGRESS NOTE    Linda Koch  [id number] DOB: 09/11/1990 DOA: 11/21/2015 PCP: No primary care provider on file.  Outpatient Specialists: OB    Brief Narrative:  24 G4 P1 with recent medical abortion 10/09/15 Prior history of paroxysmal A. fib which converted on its own Known history of adjustment disorder [facility]2015 with nausea vomiting and discomfort in the abdomen after drinking 4 shots  anion gap of 20 CO2 was normal however and ketones in her urine  Emergency room workup and management revealed that she got Zofran, Protonix, Haldol and Phenergan but this did not help and she still continued to vomit despite by mouth trials and she was referred for admission  Assessment & Plan:   Principal Problem:   Intractable vomiting  Had abortion feb 2017--no intermittent vomiting interesting to note was n at time of first menses ~ time of admission--? Adenomyosis? Slow to resolve Advised 1 tsp liquid, then 20 min later 2 tsp then 20 min later 3 tsp--etc etc to build up capacity of sotmach Soft diet odt zofran-->IV zofran + IV compazine Cont PPI/Pepcid IV for now D/w Dr. Ysidro EvertPyrtle-recs IV reglans and carafate addition Patient has improved and had some clear soup and toast today If tolerates 2 separate meals, likely can d/c home am     h/o of P Atrial fibrillation with RVR-now benign stable   Severe hypokalemia K 2.8-->3.4 repalce with IV 80-->40 meq in d5 50//hr Recheck labs am Am magensium    Mild tetrahydrocannabinol (THC) abuse   Adjustment disorder NOS Op Pyshc input     DVT prophylaxis: Lovenox Code Status: Full Family Communication: none Disposition Plan: home likely 24 hr     Subjective: Some vomit But had lunch seems better and wishes to go home  Objective: Filed Vitals:   11/23/15 1617 11/23/15 1957 11/24/15 0508 11/24/15 0900  BP: 140/112 102/63 105/67 110/70  Pulse: 84 70 75 69  Temp: 98.4 F  (36.9 C) 99.1 F (37.3 C) 97.8 F (36.6 C) 97.1 F (36.2 C)  TempSrc: Oral Oral Oral Oral  Resp: 16 15 15 16   Height:      Weight:      SpO2: 100% 100% 100% 100%    Intake/Output Summary (Last 24 hours) at 11/24/15 1413 Last data filed at 11/24/15 0600  Gross per 24 hour  Intake 1755.83 ml  Output      1 ml  Net 1754.83 ml   Filed Weights   11/21/15 2049  Weight: 61.5 kg (135 lb 9.3 oz)    Examination:  General exam: comfortable  Respiratory system: Clear to auscultation. Respiratory effort normal. Cardiovascular system: S1 & S2 heard, RRR. No JVD, murmurs, rubs, gallops or clicks. No pedal edema. Gastrointestinal system: Abdomen is nondistended, slight epig tender. No organomegaly or masses felt. Normal bowel sounds heard. Central nervous system: Alert and oriented. No focal neurological deficits.     Data Reviewed: I have personally reviewed following labs and imaging studies  CBC:  Recent Labs Lab 11/18/15 0627 11/21/15 1050  WBC 5.5 8.8  NEUTROABS 3.1  --   HGB 13.1 14.9  HCT 38.7 44.3  MCV 94.2 93.3  PLT 296 270   Basic Metabolic Panel:  Recent Labs Lab 11/21/15 1909 11/22/15 0654 11/22/15 1626 11/23/15 1059 11/24/15 0413  NA 138 138 140 142 142  K 2.8* 2.8* 3.9 3.4* 3.6  CL 97* 99* 104 104 105  CO2 28 28 25 25 27   GLUCOSE  86 94 95 98 96  BUN 6 <5* <5* <5* <5*  CREATININE 0.72 0.77 0.68 0.80 0.95  CALCIUM 9.1 8.7* 8.9 9.4 9.4  MG  --  2.1  --   --   --    GFR: Estimated Creatinine Clearance: 76.2 mL/min (by C-G formula based on Cr of 0.95). Liver Function Tests:  Recent Labs Lab 11/18/15 0627 11/21/15 1050 11/22/15 0654 11/23/15 1059  AST ALT 13* 17 12* 14  ALKPHOS 45 50 36* 41  BILITOT 1.0 1.6* 1.2 1.1  PROT 7.2 8.1 5.9* 7.6  ALBUMIN 4.1 4.7 3.3* 4.2    Recent Labs Lab 11/18/15 0627 11/21/15 1050  LIPASE 22 18   No results for input(s): AMMONIA in the last 168 hours. Coagulation Profile:  Recent  Labs Lab 11/22/15 0654  INR 1.18   Cardiac Enzymes:  Recent Labs Lab 11/21/15 1050  TROPONINI <0.03   BNP (last 3 results) No results for input(s): PROBNP in the last 8760 hours. HbA1C: No results for input(s): HGBA1C in the last 72 hours. CBG: No results for input(s): GLUCAP in the last 168 hours. Lipid Profile: No results for input(s): CHOL, HDL, LDLCALC, TRIG, CHOLHDL, LDLDIRECT in the last 72 hours. Thyroid Function Tests: No results for input(s): TSH, T4TOTAL, FREET4, T3FREE, THYROIDAB in the last 72 hours. Anemia Panel: No results for input(s): VITAMINB12, FOLATE, FERRITIN, TIBC, IRON, RETICCTPCT in the last 72 hours. Urine analysis:    Component Value Date/Time   COLORURINE AMBER* 11/21/2015 1200   APPEARANCEUR CLOUDY* 11/21/2015 1200   LABSPEC 1.040* 11/21/2015 1200   PHURINE 6.0 11/21/2015 1200   GLUCOSEU NEGATIVE 11/21/2015 1200   HGBUR MODERATE* 11/21/2015 1200   BILIRUBINUR SMALL* 11/21/2015 1200   KETONESUR >80* 11/21/2015 1200   PROTEINUR 30* 11/21/2015 1200   UROBILINOGEN 0.2 10/12/2014 1628   NITRITE NEGATIVE 11/21/2015 1200   LEUKOCYTESUR NEGATIVE 11/21/2015 1200   Sepsis Labs: (procalcitonin:4,lacticidven:4)  )No results found for this or any previous visit (from the past 240 hour(s)).       Radiology Studies: No results found.      Scheduled Meds: . dicyclomine  20 mg Oral TID AC & HS  . enoxaparin (LOVENOX) injection  40 mg Subcutaneous Q24H  . famotidine (PEPCID) IV  20 mg Intravenous Q12H  . metoCLOPramide (REGLAN) injection  10 mg Intravenous 4 times per day  . pantoprazole (PROTONIX) IV  40 mg Intravenous Q12H  . prochlorperazine  10 mg Intravenous Q4H  . sucralfate  1 g Oral TID WC & HS   Continuous Infusions: . dextrose 5 % and 0.9% NaCl 1,000 mL with potassium chloride 40 mEq infusion 50 mL/hr at 11/24/15 1026     LOS: 1 day    Time spent: 56    Pleas Koch, MD [facility] ([phone]   If  7PM-7AM, please contact night-coverage [email or web address] Password [email or web address] 11/24/2015, 2:13 PM

## 2015-11-25 LAB — BASIC METABOLIC PANEL WITH GFR
Anion gap: 7 (ref 5–15)
BUN: <5 mg/dL — ABNORMAL LOW (ref 6–20)
CO2: 26 mmol/L (ref 22–32)
Calcium: 9.2 mg/dL (ref 8.9–10.3)
Chloride: 108 mmol/L (ref 101–111)
Creatinine, Ser: 0.99 mg/dL (ref 0.44–1.00)
GFR calc Af Amer: >60 mL/min
GFR calc non Af Amer: >60 mL/min
Glucose, Bld: 100 mg/dL — ABNORMAL HIGH (ref 65–99)
Potassium: 4.3 mmol/L (ref 3.5–5.1)
Sodium: 141 mmol/L (ref 135–145)

## 2015-11-25 MED ORDER — PROMETHAZINE HCL 25 MG RE SUPP: 25.0000 mg | Freq: Four times a day (QID) | RECTAL | Status: DC | PRN

## 2015-11-25 MED ORDER — SUCRALFATE 1 GM/10ML PO SUSP: 1.0000 g | Freq: Three times a day (TID) | ORAL | Status: DC

## 2015-11-25 MED ORDER — ONDANSETRON 4 MG PO TBDP: 4.0000 mg | ORAL_TABLET | Freq: Three times a day (TID) | ORAL | Status: DC | PRN

## 2015-11-25 MED ORDER — METOCLOPRAMIDE HCL 5 MG PO TABS: 5.0000 mg | ORAL_TABLET | Freq: Three times a day (TID) | ORAL | Status: DC

## 2015-11-25 NOTE — Discharge Summary (Signed)
Physician Discharge Summary  Linda Koch JXB:[id number] DOB: 1991/04/29 DOA: 11/21/2015  PCP: No primary care provider on file.  Admit date: 11/21/2015 Discharge date: 11/25/2015  Time spent: 35 minutes  Recommendations for Outpatient Follow-up:  1. Please follow-up with OB/GYN-needs? Workup for menstrual disorder/ND no myosis/endometriosis? 2. Discharged home on Reglan, Phenergan suppository, Zofran ODT addition to her other meds-C changes to [facility] 3. Recommend outpatient follow-up with PCP 4. Recommend soft diet for next 1-2 days.  Discharge Diagnoses:  Principal Problem:   Intractable vomiting Active Problems:   h/o of P Atrial fibrillation with RVR-now benign   Irregular cardiac rhythm   Mild tetrahydrocannabinol (THC) abuse   Adjustment disorder NOS   Discharge Condition: Improved  Diet recommendation: Regular regular  Filed Weights   11/21/15 2049  Weight: 61.5 kg (135 lb 9.3 oz)   History of present illness:  49  G4 P1 with recent medical abortion 10/09/15 Prior history of paroxysmal A. fib which converted on its own Known history of adjustment disorder initially presented to [facility] system 11/18/2015 with nausea vomiting and discomfort in the abdomen after drinking 4 shots  anion gap of 20 CO2 was normal however and ketones in her urine   Emergency room workup and management revealed that she got Zofran, Protonix, Haldol and Phenergan but this did not help and she still continued to vomit despite by mouth trials and she was referred for admission   Hospital Course:    Intractable vomiting  Had abortion feb 2017--no intermittent vomiting interesting to note was n at time of first menses ~ time of admission--? Adenomyosis? Slow to resolve Advised 1 tsp liquid, then 20 min later 2 tsp then 20 min later 3 tsp--etc etc to build up capacity of sotmach Soft diet odt zofran-->IV zofran + IV compazine Cont PPI/Pepcid IV for now D/w Dr. Ysidro Evert IV  reglans and carafate addition  The patient was much improved after recommendations from GI were made and it was felt that patient would benefit from ongoing use of Carafate and Reglan in addition to her other meds that we have mentioned above     h/o of P Atrial fibrillation with RVR-now benign stable   Severe hypokalemia K 2.8-->3.4 repalce with IV 80-->40 meq in d5 50//hr Initially needed significant resuscitation with potassium and on discharge potassium 4.3    Mild tetrahydrocannabinol (THC) abuse   Adjustment disorder NOS Op Pyshc input    Discharge Exam: Filed Vitals:   11/25/15 0520 11/25/15 0901  BP: 115/73 144/98  Pulse: 88 115  Temp: 98.6 F (37 C) 98.8 F (37.1 C)  Resp: 16 18    alert pleasant oriented no apparent distress Had little bit of breakfast this morning No further vomiting Mild soft stool probably from Reglan.  General: eomi ncat Cardiovascular: s1 s2 no m/r/g Respiratory: clear no added sound  Discharge Instructions   Discharge Instructions    Diet - low sodium heart healthy    Complete by:  As directed      Discharge instructions    Complete by:  As directed   Would recommend that you continue to take your Prilosec, Pepcid, I have replaced your Zofran with Zofran ODT and give new prescription for Phenergan suppositories. Please seek medical attention if you have intractable further nausea vomiting Please seek OB/GYN input if you find that this recurs at the time of your cycle-this may be related to your cycle in some unclear way.     Increase activity slowly  Complete by:  As directed           Current Discharge Medication List    START taking these medications   Details  metoCLOPramide (REGLAN) 5 MG tablet Take 1 tablet (5 mg total) by mouth 3 (three) times daily before meals. Qty: 9 tablet, Refills: 0    ondansetron (ZOFRAN-ODT) 4 MG disintegrating tablet Take 1 tablet (4 mg total) by mouth every 8 (eight) hours as needed for  nausea or vomiting. Qty: 20 tablet, Refills: 0    promethazine (PHENERGAN) 25 MG suppository Place 1 suppository (25 mg total) rectally every 6 (six) hours as needed for nausea or vomiting. Qty: 12 each, Refills: 0    sucralfate (CARAFATE) 1 GM/10ML suspension Take 10 mLs (1 g total) by mouth 4 (four) times daily -  with meals and at bedtime. Qty: 420 mL, Refills: 0      CONTINUE these medications which have NOT CHANGED   Details  acetaminophen (TYLENOL) 500 MG tablet Take 500 mg by mouth every 6 (six) hours as needed for moderate pain.    calcium carbonate (TUMS - DOSED IN MG ELEMENTAL CALCIUM) 500 MG chewable tablet Chew 1 tablet by mouth daily as needed for indigestion or heartburn.    esomeprazole (NEXIUM) 40 MG capsule Take 1 capsule (40 mg total) by mouth daily. Qty: 30 capsule, Refills: 0    famotidine (PEPCID) 20 MG tablet Take 1 tablet (20 mg total) by mouth 2 (two) times daily as needed for heartburn or indigestion. Qty: 30 tablet, Refills: 0      STOP taking these medications     ondansetron (ZOFRAN) 8 MG tablet      promethazine (PHENERGAN) 12.5 MG tablet      aspirin EC 81 MG EC tablet      pantoprazole (PROTONIX) 20 MG tablet        No Known Allergies    The results of significant diagnostics from this hospitalization (including imaging, microbiology, ancillary and laboratory) are listed below for reference.    Significant Diagnostic Studies: Dg Chest 2 View  11/21/2015  CLINICAL DATA:  Chest pain after vomiting EXAM: CHEST  2 VIEW COMPARISON:  05/30/2015 FINDINGS: Normal heart size and mediastinal contours. No visible pneumomediastinum or chest wall gas. No infiltrate or edema. No effusion or pneumothorax. No acute osseous findings. IMPRESSION: Negative chest. Electronically Signed   By: Marnee SpringJonathon  Watts M.D.   On: 11/21/2015 13:12    Microbiology: No results found for this or any previous visit (from the past 240 hour(s)).   Labs: Basic Metabolic  Panel:  Recent Labs Lab 11/22/15 0654 11/22/15 1626 11/23/15 1059 11/24/15 0413 11/25/15 0459  NA 138 140 142 142 141  K 2.8* 3.9 3.4* 3.6 4.3  CL 99* 104 104 105 108  CO2 28 25 25 27 26   GLUCOSE 94 95 98 96 100*  BUN <5* <5* <5* <5* <5*  CREATININE 0.77 0.68 0.80 0.95 0.99  CALCIUM 8.7* 8.9 9.4 9.4 9.2  MG 2.1  --   --   --   --    Liver Function Tests:  Recent Labs Lab 11/21/15 1050 11/22/15 0654 11/23/15 1059  AST 20 15 16   ALT 17 12* 14  ALKPHOS 50 36* 41  BILITOT 1.6* 1.2 1.1  PROT 8.1 5.9* 7.6  ALBUMIN 4.7 3.3* 4.2    Recent Labs Lab 11/21/15 1050  LIPASE 18   No results for input(s): AMMONIA in the last 168 hours. CBC:  Recent Labs Lab  11/21/15 1050  WBC 8.8  HGB 14.9  HCT 44.3  MCV 93.3  PLT 270   Cardiac Enzymes:  Recent Labs Lab 11/21/15 1050  TROPONINI <0.03   BNP: BNP (last 3 results) No results for input(s): BNP in the last 8760 hours.  ProBNP (last 3 results) No results for input(s): PROBNP in the last 8760 hours.  CBG: No results for input(s): GLUCAP in the last 168 hours.     SignedRhetta Mura MD   [facility] 11/25/2015, 9:54 AM

## 2016-02-07 ENCOUNTER — Other Ambulatory Visit: Payer: Self-pay | Admitting: Obstetrics & Gynecology

## 2016-02-11 LAB — CYTOLOGY - PAP

## 2016-05-08 IMAGING — US US OB TRANSVAGINAL
1 series · 14 of 28 positions shown · non-contrast
Comparison: Ultrasound January 02, 2013.

CLINICAL DATA: Vaginal bleeding in first trimester of pregnancy.

EXAM:
OBSTETRIC <14 WK US AND TRANSVAGINAL OB US
TECHNIQUE: Both transabdominal and transvaginal ultrasound examinations were
performed for complete evaluation of the gestation as well as the
maternal uterus, adnexal regions, and pelvic cul-de-sac.
Transvaginal technique was performed to assess early pregnancy.

[Series 1: us ob comp less 14 wks · 14 of 86 slices shown]
[im 4/86]
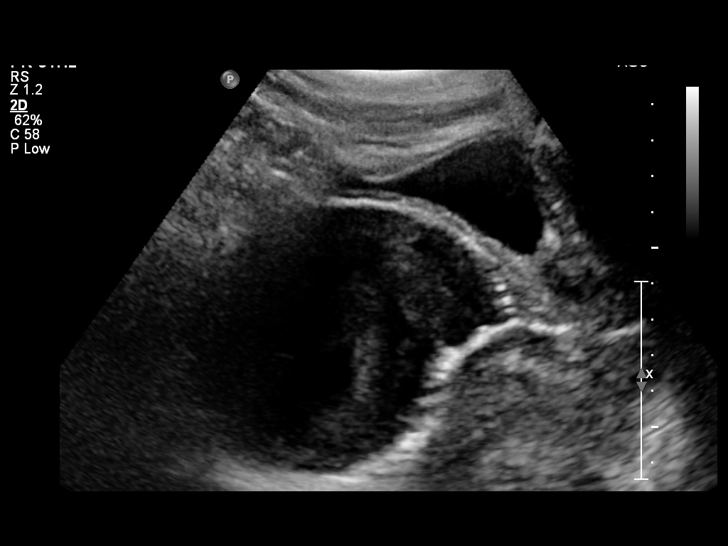
[im 10/86]
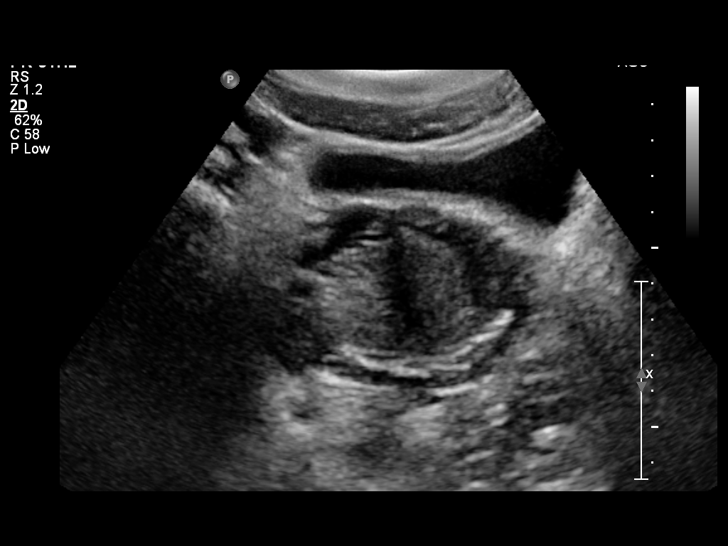
[im 16/86]
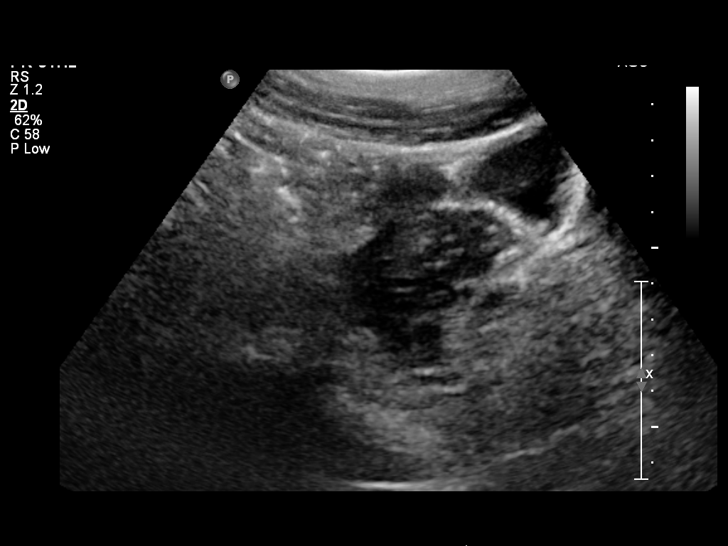
[im 23/86]
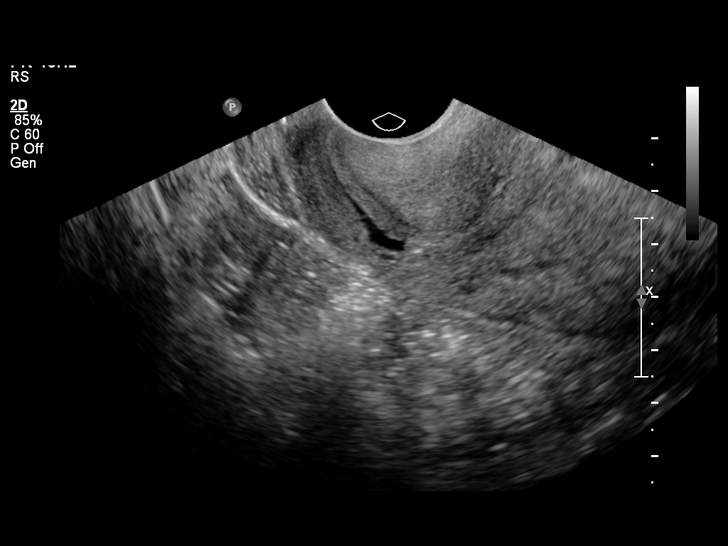
[im 29/86]
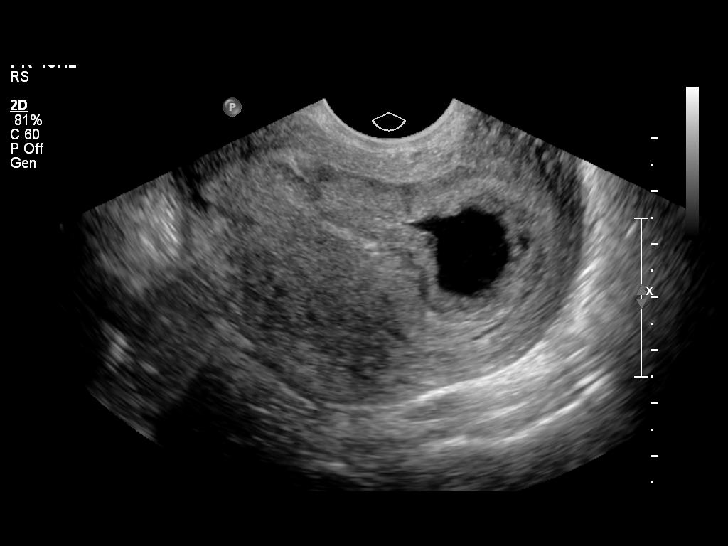
[im 35/86]
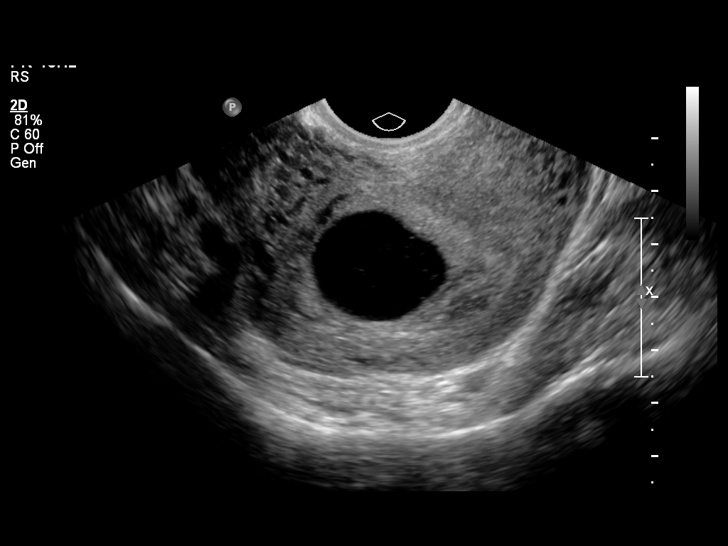
[im 41/86]
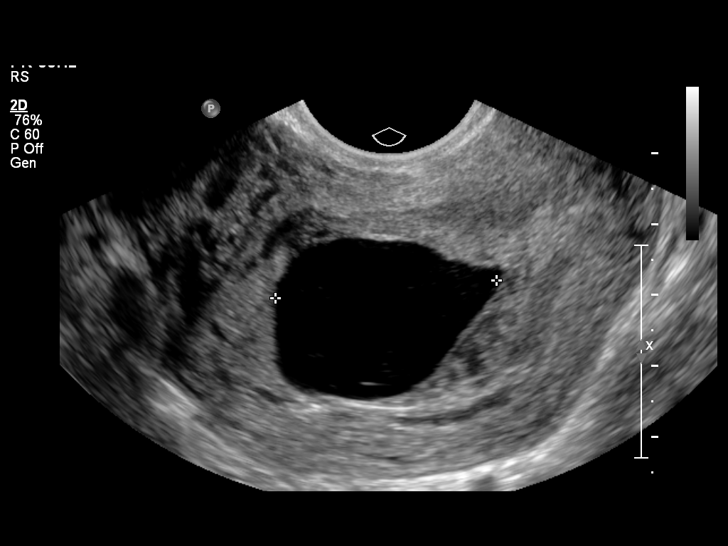
[im 48/86]
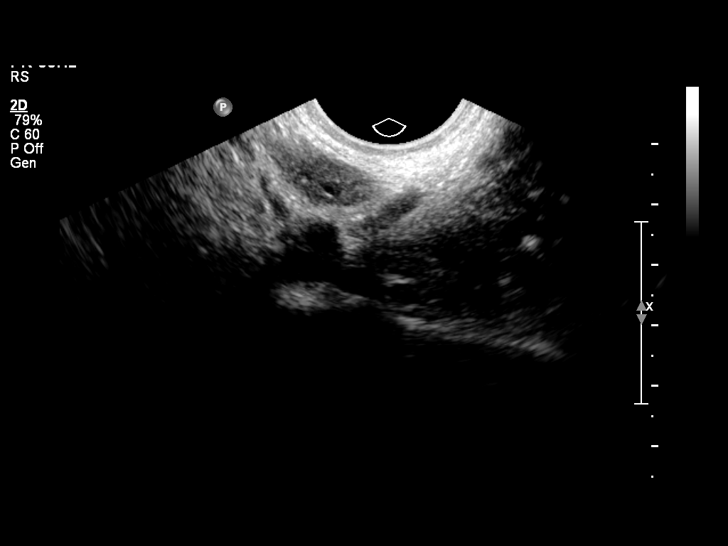
[im 54/86]
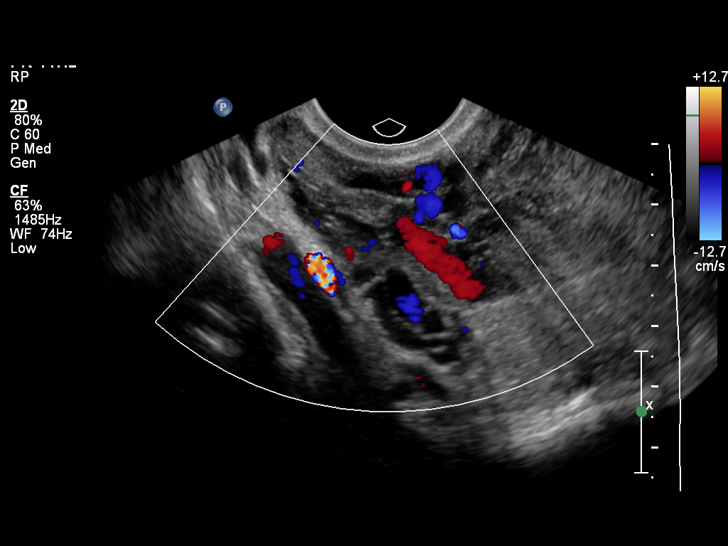
[im 60/86]
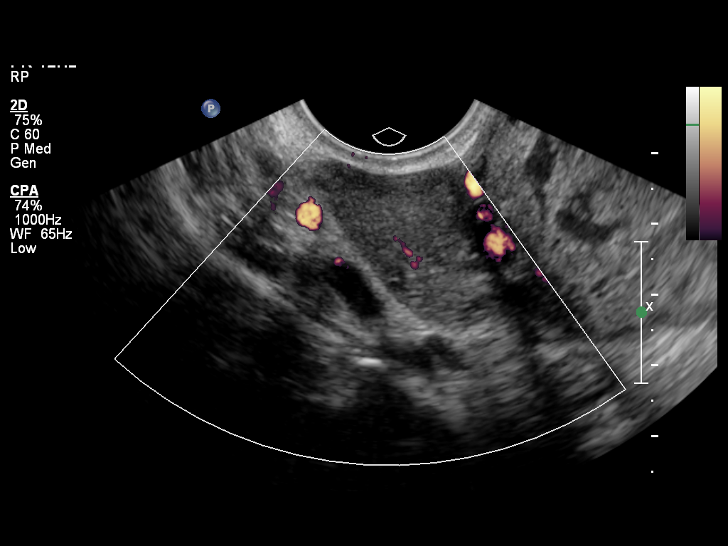
[im 67/86]
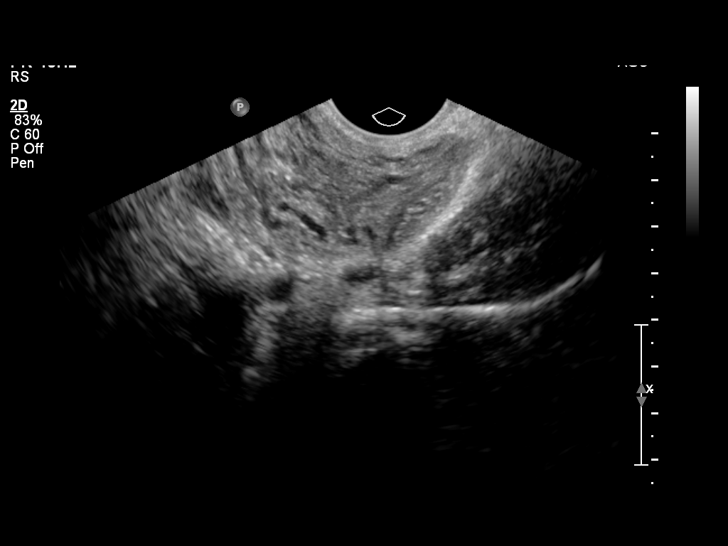
[im 73/86]
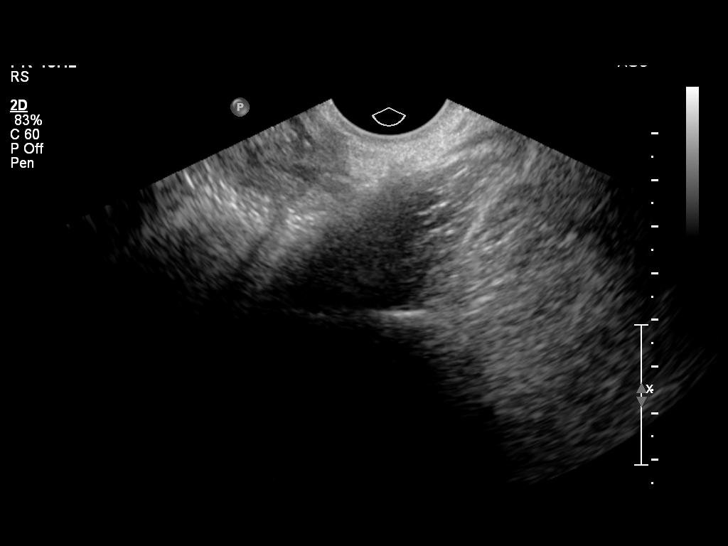
[im 79/86]
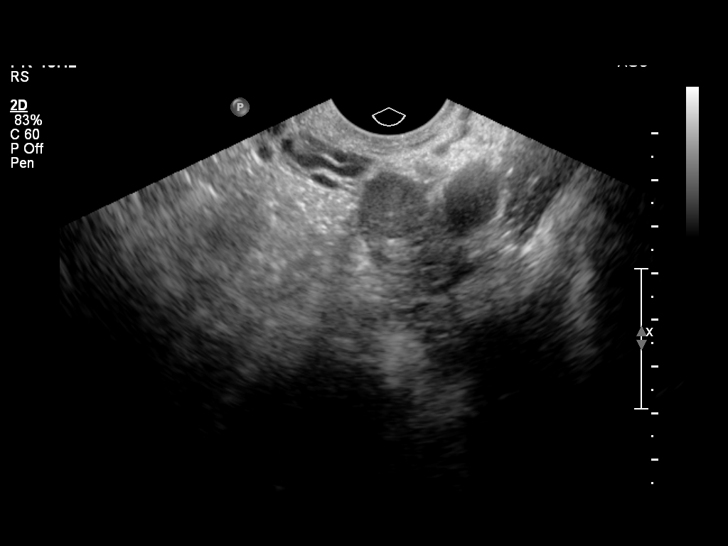
[im 86/86]
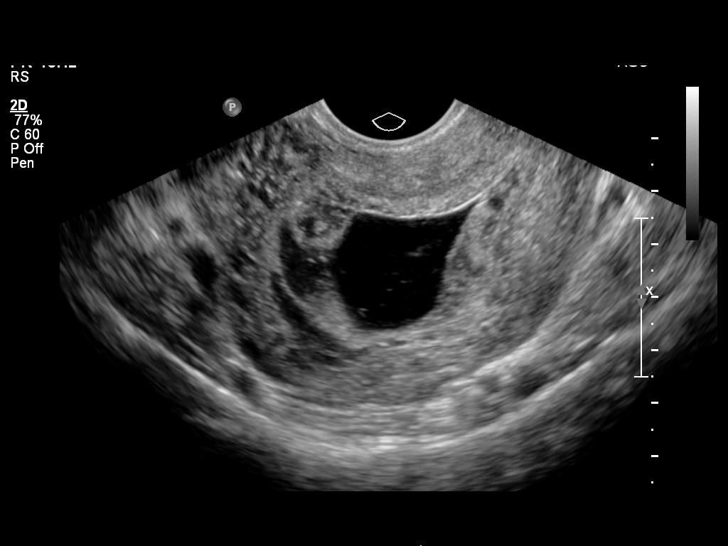

[14 of 28 positions shown; findings below may reference images not displayed]

FINDINGS: Intrauterine gestational sac: Irregular intrauterine fluid
collection is noted with was several internal echoes.

Yolk sac:  Not visualized.

Embryo:  Not visualized.

Cardiac Activity: Not visualized.

MSD: 23.3  mm   7 w   3  d

Maternal uterus/adnexae: Ovaries appear normal. No free fluid is
noted.
IMPRESSION: Findings are suspicious but not yet definitive for failed pregnancy.
Recommend follow-up US in 10-14 days for definitive diagnosis. This
recommendation follows SRU consensus guidelines: Diagnostic Criteria
for Nonviable Pregnancy Early in the First Trimester. N Engl J Med

## 2017-06-17 IMAGING — DX DG CHEST 2V
2 series · 2 of 2 positions shown · non-contrast
Comparison: 05/30/2015

CLINICAL DATA: Chest pain after vomiting

EXAM:
CHEST  2 VIEW

[chest pa]
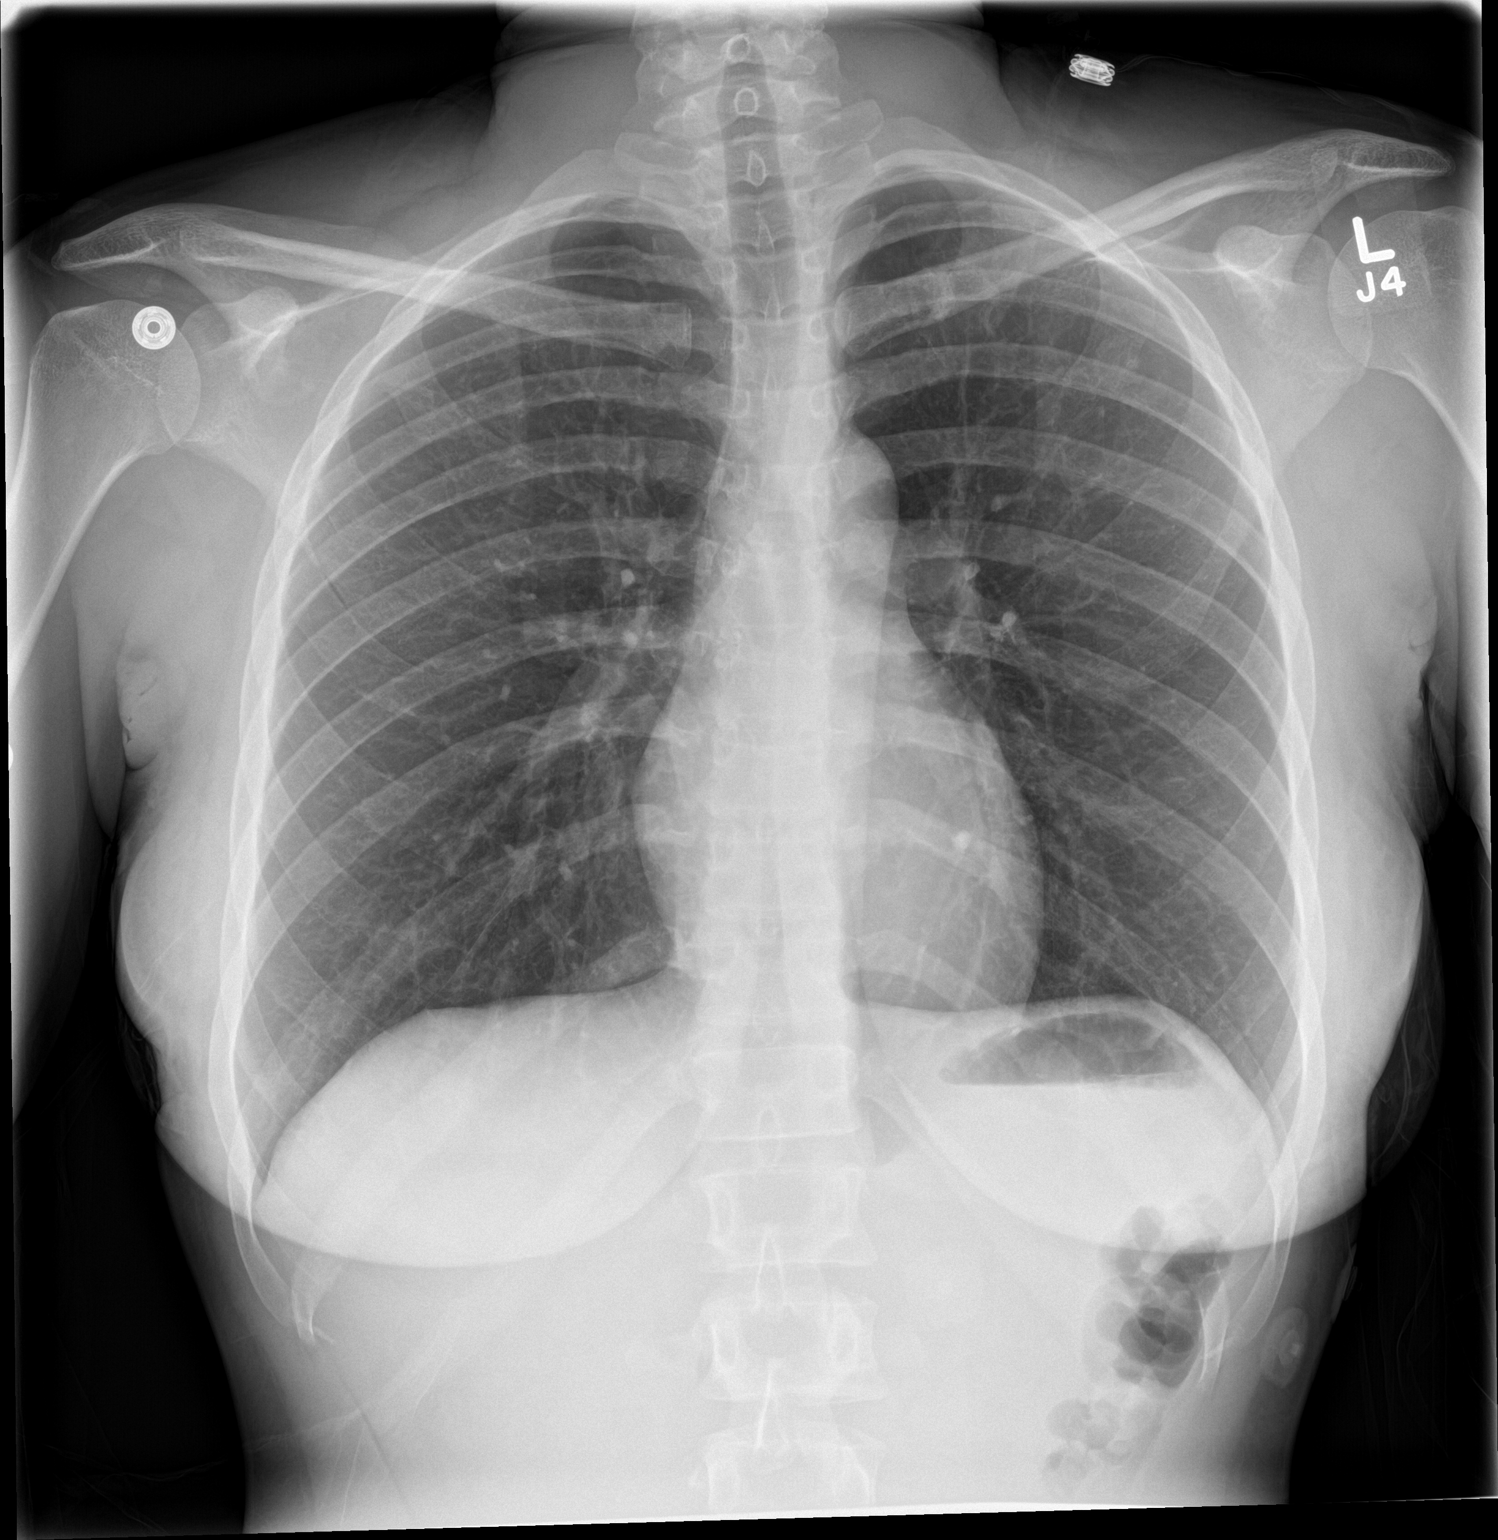

[chest lat]
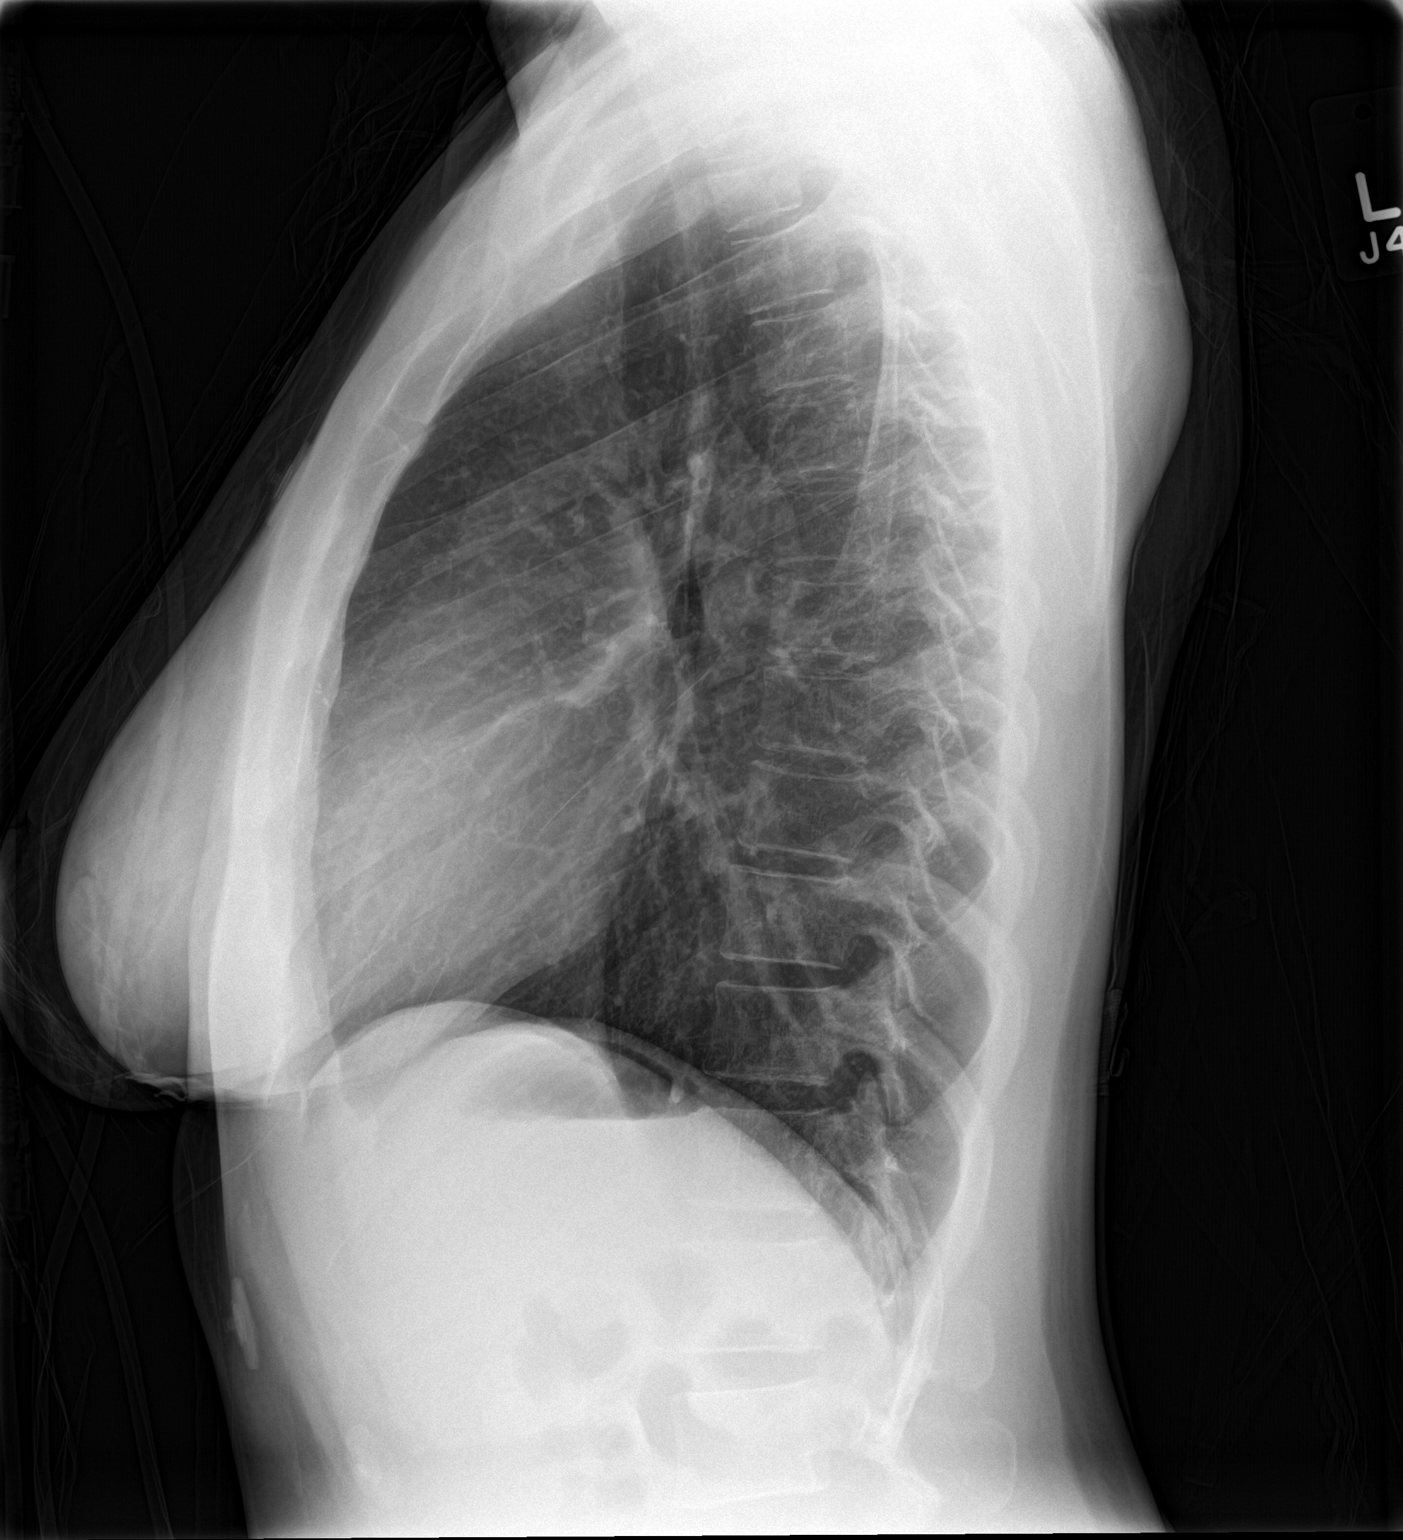

[2 of 2 positions shown; findings below may reference images not displayed]

FINDINGS: Normal heart size and mediastinal contours. No visible
pneumomediastinum or chest wall gas. No infiltrate or edema. No
effusion or pneumothorax. No acute osseous findings.
IMPRESSION: Negative chest.

## 2018-09-17 ENCOUNTER — Encounter (HOSPITAL_COMMUNITY): Payer: Self-pay

## 2018-09-17 ENCOUNTER — Other Ambulatory Visit: Payer: Self-pay

## 2018-09-17 ENCOUNTER — Emergency Department (HOSPITAL_COMMUNITY)
Admission: EM | Admit: 2018-09-17 | Discharge: 2018-09-18 | Disposition: A | Discharge status: Home / Self Care | Payer: 59 | Attending: Emergency Medicine | Admitting: Emergency Medicine

## 2018-09-17 DIAGNOSIS — E876 Hypokalemia: Secondary | ICD-10-CM

## 2018-09-17 DIAGNOSIS — Z79899 Other long term (current) drug therapy: Secondary | ICD-10-CM | POA: Insufficient documentation

## 2018-09-17 DIAGNOSIS — F411 Generalized anxiety disorder: Secondary | ICD-10-CM | POA: Insufficient documentation

## 2018-09-17 DIAGNOSIS — Z87891 Personal history of nicotine dependence: Secondary | ICD-10-CM | POA: Insufficient documentation

## 2018-09-17 DIAGNOSIS — F419 Anxiety disorder, unspecified: Secondary | ICD-10-CM

## 2018-09-17 DIAGNOSIS — R45851 Suicidal ideations: Secondary | ICD-10-CM | POA: Insufficient documentation

## 2018-09-17 DIAGNOSIS — F331 Major depressive disorder, recurrent, moderate: Secondary | ICD-10-CM | POA: Insufficient documentation

## 2018-09-17 DIAGNOSIS — R112 Nausea with vomiting, unspecified: Secondary | ICD-10-CM

## 2018-09-17 LAB — CBC
HEMATOCRIT: 45.1 % (ref 36.0–46.0)
Hemoglobin: 15.1 g/dL — ABNORMAL HIGH (ref 12.0–15.0)
MCH: 30.3 pg (ref 26.0–34.0)
MCHC: 33.5 g/dL (ref 30.0–36.0)
MCV: 90.4 fL (ref 80.0–100.0)
NRBC: 0 % (ref 0.0–0.2)
PLATELETS: 430 10*3/uL — AB (ref 150–400)
RBC: 4.99 MIL/uL (ref 3.87–5.11)
RDW: 13.2 % (ref 11.5–15.5)
WBC: 10.8 10*3/uL — ABNORMAL HIGH (ref 4.0–10.5)

## 2018-09-17 LAB — COMPREHENSIVE METABOLIC PANEL
ALBUMIN: 5 g/dL (ref 3.5–5.0)
ALT: 31 U/L (ref 0–44)
ANION GAP: 16 — AB (ref 5–15)
AST: 36 U/L (ref 15–41)
Alkaline Phosphatase: 53 U/L (ref 38–126)
BILIRUBIN TOTAL: 1.4 mg/dL — AB (ref 0.3–1.2)
BUN: 10 mg/dL (ref 6–20)
CALCIUM: 9.8 mg/dL (ref 8.9–10.3)
CHLORIDE: 93 mmol/L — AB (ref 98–111)
CO2: 29 mmol/L (ref 22–32)
Creatinine, Ser: 0.69 mg/dL (ref 0.44–1.00)
GFR calc Af Amer: >60 mL/min (ref >60)
GFR calc non Af Amer: >60 mL/min (ref >60)
Glucose, Bld: 104 mg/dL — ABNORMAL HIGH (ref 70–99)
Potassium: 2.4 mmol/L — CL (ref 3.5–5.1)
Sodium: 138 mmol/L (ref 135–145)
Total Protein: 8.9 g/dL — ABNORMAL HIGH (ref 6.5–8.1)

## 2018-09-17 LAB — I-STAT BETA HCG BLOOD, ED (MC, WL, AP ONLY): I-stat hCG, quantitative: <5 m[IU]/mL (ref <5)

## 2018-09-17 LAB — SALICYLATE LEVEL

## 2018-09-17 LAB — RAPID URINE DRUG SCREEN, HOSP PERFORMED
AMPHETAMINES: NOT DETECTED
BARBITURATES: NOT DETECTED
BENZODIAZEPINES: NOT DETECTED
Cocaine: NOT DETECTED
Opiates: NOT DETECTED
Tetrahydrocannabinol: POSITIVE — AB

## 2018-09-17 LAB — ETHANOL

## 2018-09-17 LAB — ACETAMINOPHEN LEVEL: Acetaminophen (Tylenol), Serum: <10 ug/mL — ABNORMAL LOW (ref 10–30)

## 2018-09-17 MED ORDER — POTASSIUM CHLORIDE CRYS ER 20 MEQ PO TBCR
40.0000 meq | EXTENDED_RELEASE_TABLET | Freq: Once | ORAL | Status: AC
  Administered 2018-09-18: 40 meq via ORAL
  Filled 2018-09-17: qty 2

## 2018-09-17 MED ORDER — ONDANSETRON HCL 4 MG/2ML IJ SOLN
4.0000 mg | Freq: Once | INTRAMUSCULAR | Status: AC
  Administered 2018-09-17: 4 mg via INTRAVENOUS
  Filled 2018-09-17: qty 2

## 2018-09-17 MED ORDER — SODIUM CHLORIDE 0.9 % IV BOLUS
1000.0000 mL | Freq: Once | INTRAVENOUS | Status: AC
  Administered 2018-09-17: 1000 mL via INTRAVENOUS

## 2018-09-17 MED ORDER — POTASSIUM CHLORIDE 10 MEQ/100ML IV SOLN
10.0000 meq | Freq: Once | INTRAVENOUS | Status: AC
  Administered 2018-09-17: 10 meq via INTRAVENOUS
  Filled 2018-09-17: qty 100

## 2018-09-17 NOTE — ED Notes (Signed)
Date and time results received: 09/17/18 2106   Test: Potassium Critical Value: 2.4  Name of Provider Notified: Dr. Rubin Payor  Orders Received? Or Actions Taken?: see chart
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
## 2018-09-17 NOTE — ED Notes (Signed)
Bed: [facility]3 Expected date:  Expected time:  Means of arrival:  Comments: 

## 2018-09-17 NOTE — ED Notes (Signed)
TTS at bedside. 

## 2018-09-17 NOTE — ED Provider Notes (Signed)
[facility]-EMERGENCY DEPT Provider Note   CSN: [id number] Arrival date & time: 09/17/18  1905     History   Chief Complaint Chief Complaint  Patient presents with  . Suicidal  . Abdominal Pain    HPI Linda Koch is a 28 y.o. female.  HPI Patient has a history of anxiety has had some suicidal thought.  More anxiety and tearful.  States she has had nausea and vomiting.  States when her anxiety flares up she will vomit.  Has been doing bad since Monday.  Has had some social issues at home.. Past Medical History:  Diagnosis Date  . Chlamydia     Patient Active Problem List   Diagnosis Date Noted  . Intractable vomiting 11/21/2015  . Adjustment disorder NOS 11/21/2015  . Pregnancy   . Emesis   . h/o of P Atrial fibrillation with RVR-now benign 06/30/2015  . Irregular cardiac rhythm 06/30/2015  . Mild tetrahydrocannabinol (THC) abuse 06/30/2015  . Nausea and vomiting during pregnancy   . Vaginal bleeding in pregnancy 10/12/2014    Past Surgical History:  Procedure Laterality Date  . CESAREAN SECTION N/A 03/29/2014   Procedure: CESAREAN SECTION;  Surgeon: Loney Laurence, MD;  Location: [facility] ORS;  Service: Obstetrics;  Laterality: N/A;  . NO PAST SURGERIES       OB History    Gravida  5   Para  1   Term      Preterm  1   AB  2   Living  1     SAB  1   TAB  1   Ectopic      Multiple      Live Births  1            Home Medications    Prior to Admission medications   Medication Sig Start Date End Date Taking? Authorizing Provider  acetaminophen (TYLENOL) 500 MG tablet Take 500 mg by mouth every 6 (six) hours as needed for moderate pain.    [provider]  calcium carbonate (TUMS - DOSED IN MG ELEMENTAL CALCIUM) 500 MG chewable tablet Chew 1 tablet by mouth daily as needed for indigestion or heartburn.    [provider]  esomeprazole (NEXIUM) 40 MG capsule Take 1 capsule (40 mg total) by mouth daily.  11/18/15   Charlynne Pander, MD  famotidine (PEPCID) 20 MG tablet Take 1 tablet (20 mg total) by mouth 2 (two) times daily as needed for heartburn or indigestion. 11/18/15   Charlynne Pander, MD  metoCLOPramide (REGLAN) 5 MG tablet Take 1 tablet (5 mg total) by mouth 3 (three) times daily before meals. 11/25/15   Rhetta Mura, MD  ondansetron (ZOFRAN-ODT) 4 MG disintegrating tablet Take 1 tablet (4 mg total) by mouth every 8 (eight) hours as needed for nausea or vomiting. 11/25/15   Rhetta Mura, MD  promethazine (PHENERGAN) 25 MG suppository Place 1 suppository (25 mg total) rectally every 6 (six) hours as needed for nausea or vomiting. 11/25/15   Rhetta Mura, MD  sucralfate (CARAFATE) 1 GM/10ML suspension Take 10 mLs (1 g total) by mouth 4 (four) times daily -  with meals and at bedtime. 11/25/15   Rhetta Mura, MD    Family History Family History  Problem Relation Age of Onset  . Heart disease Maternal Grandfather   . Bipolar disorder Mother   . Schizophrenia Father     Social History Social History   Tobacco Use  . Smoking status:  Former Smoker  . Smokeless tobacco: Never Used  . Tobacco comment: quit iearly 2015  Substance Use Topics  . Alcohol use: Yes    Alcohol/week: 0.0 standard drinks    Comment: rare, last was Christmas--Had 4 shots of liquos 11/18/15 causing gastritis and ED visit  . Drug use: Yes    Types: Marijuana    Comment: 2015     Allergies   Patient has no known allergies.   Review of Systems Review of Systems  Constitutional: Positive for appetite change.  HENT: Negative for congestion.   Respiratory: Negative for shortness of breath.   Cardiovascular: Negative for chest pain.  Gastrointestinal: Positive for nausea and vomiting. Negative for abdominal pain.  Genitourinary: Negative for flank pain.  Musculoskeletal: Negative for back pain.  Skin: Negative for rash.  Neurological: Negative for seizures.    Psychiatric/Behavioral: Positive for dysphoric mood and suicidal ideas.     Physical Exam Updated Vital Signs BP 108/73 (BP Location: Right Arm)   Pulse 76   Temp 98.3 F (36.8 C) (Oral)   Resp 13   Ht 5\' 4"  (1.626 m)   Wt 74.8 kg   SpO2 99%   BMI 28.32 kg/m   Physical Exam Vitals signs and nursing note reviewed.  HENT:     Head: Normocephalic.  Cardiovascular:     Rate and Rhythm: Regular rhythm.  Pulmonary:     Breath sounds: Normal breath sounds.  Abdominal:     General: Bowel sounds are normal.  Skin:    General: Skin is warm.     Capillary Refill: Capillary refill takes less than 2 seconds.  Neurological:     General: No focal deficit present.     Mental Status: She is alert.      ED Treatments / Results  Labs (all labs ordered are listed, but only abnormal results are displayed) Labs Reviewed  COMPREHENSIVE METABOLIC PANEL - Abnormal; Notable for the following components:      Result Value   Potassium 2.4 (*)    Chloride 93 (*)    Glucose, Bld 104 (*)    Total Protein 8.9 (*)    Total Bilirubin 1.4 (*)    Anion gap 16 (*)    All other components within normal limits  ACETAMINOPHEN LEVEL - Abnormal; Notable for the following components:   Acetaminophen (Tylenol), Serum <10 (*)    All other components within normal limits  CBC - Abnormal; Notable for the following components:   WBC 10.8 (*)    Hemoglobin 15.1 (*)    Platelets 430 (*)    All other components within normal limits  RAPID URINE DRUG SCREEN, HOSP PERFORMED - Abnormal; Notable for the following components:   Tetrahydrocannabinol POSITIVE (*)    All other components within normal limits  ETHANOL  SALICYLATE LEVEL  I-STAT BETA HCG BLOOD, ED ([facility], [facility], [facility] ONLY)    EKG None  Radiology No results found.  Procedures Procedures (including critical care time)  Medications Ordered in ED Medications  potassium chloride SA (K-DUR,KLOR-CON) CR tablet 40 mEq (has no administration in time  range)  sodium chloride 0.9 % bolus 1,000 mL (0 mLs Intravenous Stopped 09/17/18 2150)  ondansetron (ZOFRAN) injection 4 mg (4 mg Intravenous Given 09/17/18 2049)  potassium chloride 10 mEq in 100 mL IVPB (0 mEq Intravenous Stopped 09/17/18 2220)     Initial Impression / Assessment and Plan / ED Course  I have reviewed the triage vital signs and the nursing notes.  Pertinent  labs & imaging results that were available during my care of the patient were reviewed by me and considered in my medical decision making (see chart for details).     Patient with anxiety and suicidal thoughts.  Nausea vomiting.  States she gets this way with her anxiety.  Has some hypokalemia but labs otherwise reassuring.  Medically cleared.  To be seen by TTS  Final Clinical Impressions(s) / ED Diagnoses   Final diagnoses:  Anxiety  Non-intractable vomiting with nausea, unspecified vomiting type  Hypokalemia    ED Discharge Orders    None       Benjiman Core, MD 09/17/18 2314

## 2018-09-17 NOTE — ED Notes (Signed)
Pt has three bags (two white belongings bags and one black bag with a large blanket in it) placed inside the cabinet labeled "patient belongings 16-18 Resus A."

## 2018-09-18 ENCOUNTER — Encounter (HOSPITAL_COMMUNITY): Payer: Self-pay | Admitting: Mental Health

## 2018-09-18 MED ORDER — POTASSIUM CHLORIDE CRYS ER 20 MEQ PO TBCR: 20.0000 meq | EXTENDED_RELEASE_TABLET | Freq: Two times a day (BID) | ORAL | 0 refills | Status: AC

## 2018-09-18 NOTE — BHH Counselor (Addendum)
Clinician discussed the pt's disposition (discharge with resources). Out patient resources were provided to the pt. Pt denies, SI, HI, AVH, self-injurious behaviors and access to weapons. Pt reported, she is tired. Pt reported, she felt safe outside of the hospital. Pt reported, her plan is to stay with her boyfriends' mother and her daughter for a few weeks. Pt signed No Harm Contract. Clinician expressed to the pt if she began to feel worse do not hesitate to come to the hospital or call 911. Pt agreed.    Redmond Pulling, MS, [facility], [facility] Triage Specialist [phone]

## 2018-09-18 NOTE — ED Notes (Signed)
Pt discharge instructions given. Pt verbalizes an understanding. Pt prescription given. Pt discharged home with family. Pt denies any si, hi, or avh at this time.
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
## 2018-09-18 NOTE — ED Notes (Signed)
Pt alert and oriented. Pt denies any pain. Pt denies si,hi, and ach at this time. Pt calm and cooperative. Pt resting in bed quietly. Pt safe, will continue to monitor.

## 2018-09-18 NOTE — ED Provider Notes (Signed)
Patient seen and evaluated by TTS and felt appropriate for discharge.  Patient did have a potassium of 2.4 upon presentation.  This was replaced orally and intravenously.  She will be discharged with a potassium pill and is to follow-up with her primary doctor to have this level rechecked.   Geoffery Lyons, MD 09/18/18 [phone]

## 2018-09-18 NOTE — Discharge Instructions (Addendum)
Potassium as prescribed.  Continue other medications as previously prescribed.

## 2018-09-18 NOTE — BHH Counselor (Signed)
Clinician provided the pt with additional OPT resources for her to utilized. Clinician also discussed Mobile Crisis.    Redmond Pullingreylese D Shakur Lembo, MS, [facility], [facility] Triage Specialist [phone]
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
